# Patient Record
Sex: Female | Born: 1975 | Race: Black or African American | Hispanic: No | Marital: Married | Smoking: Never smoker
Health system: Southern US, Community
[De-identification: ages and names within clinical notes are randomized; demographics above are authoritative.]

## PROBLEM LIST (undated history)

## (undated) HISTORY — PX: OTHER SURGICAL HISTORY: SHX169

---

## 2005-11-21 ENCOUNTER — Emergency Department: Payer: Self-pay | Admitting: Emergency Medicine

## 2009-03-16 ENCOUNTER — Ambulatory Visit: Payer: Self-pay | Admitting: Obstetrics & Gynecology

## 2009-05-17 ENCOUNTER — Inpatient Hospital Stay: Payer: Self-pay

## 2011-01-05 ENCOUNTER — Ambulatory Visit: Payer: Self-pay | Admitting: Family Medicine

## 2011-01-05 ENCOUNTER — Emergency Department: Payer: Self-pay | Admitting: Emergency Medicine

## 2011-07-25 DIAGNOSIS — K219 Gastro-esophageal reflux disease without esophagitis: Secondary | ICD-10-CM | POA: Insufficient documentation

## 2011-07-25 DIAGNOSIS — J309 Allergic rhinitis, unspecified: Secondary | ICD-10-CM | POA: Insufficient documentation

## 2013-07-02 ENCOUNTER — Ambulatory Visit: Payer: Self-pay | Admitting: Obstetrics & Gynecology

## 2013-07-25 ENCOUNTER — Ambulatory Visit: Payer: Self-pay | Admitting: Obstetrics & Gynecology

## 2013-08-03 ENCOUNTER — Emergency Department: Payer: Self-pay | Admitting: Emergency Medicine

## 2013-08-18 ENCOUNTER — Observation Stay: Payer: Self-pay

## 2013-08-18 LAB — URINALYSIS, COMPLETE
RBC,UR: 15499 /HPF (ref 0–5)
Specific Gravity: 1.025 (ref 1.003–1.030)
Squamous Epithelial: 4

## 2013-08-19 ENCOUNTER — Encounter: Payer: Self-pay | Admitting: Maternal & Fetal Medicine

## 2013-08-20 LAB — URINE CULTURE

## 2013-09-03 ENCOUNTER — Encounter: Payer: Self-pay | Admitting: Pediatric Cardiology

## 2013-09-05 ENCOUNTER — Encounter: Payer: Self-pay | Admitting: Maternal & Fetal Medicine

## 2013-10-03 ENCOUNTER — Encounter: Payer: Self-pay | Admitting: Maternal & Fetal Medicine

## 2013-10-31 ENCOUNTER — Encounter: Payer: Self-pay | Admitting: Obstetrics and Gynecology

## 2013-11-14 ENCOUNTER — Inpatient Hospital Stay: Payer: Self-pay | Admitting: Obstetrics and Gynecology

## 2013-11-14 LAB — CBC WITH DIFFERENTIAL/PLATELET
BASOS PCT: 0.2 %
Basophil #: 0 10*3/uL (ref 0.0–0.1)
EOS PCT: 0.6 %
Eosinophil #: 0 10*3/uL (ref 0.0–0.7)
HCT: 32.1 % — ABNORMAL LOW (ref 35.0–47.0)
HGB: 10.7 g/dL — ABNORMAL LOW (ref 12.0–16.0)
LYMPHS ABS: 1.1 10*3/uL (ref 1.0–3.6)
LYMPHS PCT: 21.9 %
MCH: 28.9 pg (ref 26.0–34.0)
MCHC: 33.4 g/dL (ref 32.0–36.0)
MCV: 87 fL (ref 80–100)
Monocyte #: 0.5 x10 3/mm (ref 0.2–0.9)
Monocyte %: 8.9 %
NEUTROS ABS: 3.5 10*3/uL (ref 1.4–6.5)
Neutrophil %: 68.4 %
Platelet: 148 10*3/uL — ABNORMAL LOW (ref 150–440)
RBC: 3.71 10*6/uL — ABNORMAL LOW (ref 3.80–5.20)
RDW: 15.3 % — ABNORMAL HIGH (ref 11.5–14.5)
WBC: 5.1 10*3/uL (ref 3.6–11.0)

## 2013-11-15 LAB — CBC WITH DIFFERENTIAL/PLATELET
BASOS ABS: 0 10*3/uL (ref 0.0–0.1)
BASOS PCT: 0.2 %
EOS ABS: 0 10*3/uL (ref 0.0–0.7)
Eosinophil %: 0 %
HCT: 26.5 % — ABNORMAL LOW (ref 35.0–47.0)
HGB: 8.6 g/dL — ABNORMAL LOW (ref 12.0–16.0)
LYMPHS ABS: 0.8 10*3/uL — AB (ref 1.0–3.6)
Lymphocyte %: 9.9 %
MCH: 28.5 pg (ref 26.0–34.0)
MCHC: 32.6 g/dL (ref 32.0–36.0)
MCV: 87 fL (ref 80–100)
Monocyte #: 0.5 x10 3/mm (ref 0.2–0.9)
Monocyte %: 6.9 %
NEUTROS PCT: 83 %
Neutrophil #: 6.5 10*3/uL (ref 1.4–6.5)
PLATELETS: 150 10*3/uL (ref 150–440)
RBC: 3.03 10*6/uL — ABNORMAL LOW (ref 3.80–5.20)
RDW: 15.5 % — AB (ref 11.5–14.5)
WBC: 7.9 10*3/uL (ref 3.6–11.0)

## 2013-11-16 LAB — HEMATOCRIT: HCT: 22.9 % — ABNORMAL LOW (ref 35.0–47.0)

## 2013-11-17 LAB — HEMATOCRIT: HCT: 22.1 % — ABNORMAL LOW (ref 35.0–47.0)

## 2013-11-18 LAB — PATHOLOGY REPORT

## 2014-11-15 NOTE — Consult Note (Signed)
Referral Information:  Reason for Referral 39 yo gravida 2 para 1001 at 24 weeks 1 day is here for follow-up of regarding the management of her pre-gestational or gestational diabetes that was detected early this pregnancy.   Referring Physician Lyndel PleasureErica Howard NP/[facility]   Prenatal Hx The patient had an abnormal early glucose screen of 164.  On 06/19/2013, she had a 3 hr GTT of 82/198/189/82.  She has been monitoring her BSs subsequently.  She had a [facility]C at [facility] of 5.3 on 02/15/2010 and 5.4 at [facility] on 07[facility]17[facility].  She has seen the nutritionist.  [facility].  She is not currently on any medication for her blood sugars.    Fetal echo was normal on 09/03/2013.  The patient was treated for UTI on 08/18/2013.   Past Obstetrical Hx 05/18/2009 - spontaneous vaginal delivery of 7+ lb female infant at 1538 weeks gestation [facility] at [facility].  Pregnancy complicated by gestational diabetes - diet-controlled.   Home Medications: Medication Instructions Status  Prenatal vitamin one tablet  orally once a day Active  Zantac 150 150 mg oral tablet 1 tab(s) orally 2 times a day, As Needed - for Indigestion, Heartburn Active   Allergies:   Cleocin: Swelling  Flagyl: Swelling  Keflex: SOB, Swelling  Vital Signs/Notes:  Nursing Vital Signs: **Vital Signs.:   12-Feb-15 11:19  Temperature Temperature (F) 97.5  Pulse Pulse 98  Respirations Respirations 18  Systolic BP Systolic BP 129  Diastolic BP (mmHg) Diastolic BP (mmHg) 65  Pulse Ox % Pulse Ox % 99   Perinatal Consult:  LMP 23-Mar-2013   PGyn Hx Benign   PSurg Hx None   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   Soc Hx married, No substances   Review Of Systems:  Fever/Chills No   Cough No   Abdominal Pain No   Diarrhea No   Constipation Intermittently, does not need medication   Nausea/Vomiting No   SOB/DOE No   Chest Pain No   Dysuria No   Tolerating Diet Yes    Medications/Allergies Reviewed Medications/Allergies reviewed   Exam:  Today's Weight 192 lbs; 5'1"    Additional Lab/Radiology Notes BMI = 36.2  RBS today - 77  Limited US today - Breech, FHR 127, normal fluid  Blood sugars since 08/19/2013:   FBSs - 78-134 with 2/18 > 95 2 hrs after breakfast  - 75-134 with 1/15 > 120 2 hrs after lunch - 81-133 with 2/15 > 120 2 hrs after dinner - 91-145 with 3/12 > 120   Impression/Recommendations:  Impression 39 yo gravida 2 para 1001 at 24 weeks 1 day gestation with pre-gestational or gestational diabetes that was detected early this pregnancy still diet-controlled Advanced maternal age 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 I have asked her to check a FBS every other day and one 2 hr PP every day.    At the present time, I would not recommend a hypoglycemic agent. The patient is aware that the target range for FBSs is < 95 and for 2 hrs PP is < 120.  I will review her BSs again on 3/12. 2. Because of her risk factors besides diet-controlled diabetes (possibly pre-gestational diabetes, obesity and AMA), I previously recommended fetal surveillance as outlined below to include monthly ultrasounds starting at [redacted] weeks gestation (her next one is scheduled here on 3/12), weekly testing after 36 weeks and delivery at [redacted] weeks gestation.  3. If she ultimately requires treatment with a hypoglycemic, I would recommend starting the surveillance at [redacted] weeks gestation and increasing to twice weekly at [redacted] weeks gestation with consideration for delivery at 37 weeks if there is evidence of diabetic fetopathy, macrosomia or polyhydramnios.   Plan:  Ultrasound at what gestational ages Monthly > 28 weeks   Antepartum Testing Weekly, Starting at [redacted] weeks gestation   Delivery at what gestational age [redacted] weeks    Comments Thank you for allowing [place] to participate in her care.    Total Time Spent with Patient 30 minutes   >50% of visit spent in  couseling/coordination of care yes   Office Use Only 99214  Office Visit Level 4 ( ) EST detailed office/outpt   Coding Description: MATERNAL CONDITIONS/HISTORY INDICATION(S).   Adv. Maternal Age - multigravida.   Diabetes - DM.   Obesity - BMI greater than equal to 30.  Electronic Signatures: Lady Deutscher (MD)  (Signed 12-Feb-15 12:28)  Authored: Referral, Home Medications, Allergies, Vital Signs/Notes, Consult, Exam, Lab/Radiology Notes, Impression, Plan, Other Comments, Billing, Coding Description   Last Updated: 12-Feb-15 12:28 by Lady Deutscher (MD)

## 2014-11-15 NOTE — Consult Note (Signed)
Referral Information:  Reason for Referral Followup diabetes in pregnancy   Referring Physician [facility]   Prenatal Hx 39 year-old G2 P1 at 4727 5/7 weeks with pre-existing vs. gestational diabetes who returns for growth scan and to review her sugar log.  Meliss had an early glucola of 164 followed by a 3hr GTT of 82/198/189/82. She has been seen by a nutritionist, is following a diet and monitoring her sugars. She has not required medication. She had a hemoglocin A1C of 5.4% [facility] on 07/17/13.  She had a normal anatomy scan with [facility] and normal fetal echo with [facility].   Pt declined genetic counseling for Advanced Maternal Age but had Harmony [facility] which was the patient states was normal.  See prior MFM consults dated 09/05/13 adn 08/19/13 for recommendations concerning pregnancy management.   Home Medications: Medication Instructions Status  Prenatal vitamin one tablet  orally once a day Active  Zantac 150 150 mg oral tablet 1 tab(s) orally 2 times a day, As Needed - for Indigestion, Heartburn Active   Allergies:   Cleocin: Swelling  Flagyl: Swelling  Keflex: Swelling  Vital Signs/Notes:  Nursing Vital Signs:  **Vital Signs.:   12-Mar-15 13:55  Systolic BP Systolic BP 118  Diastolic BP (mmHg) Diastolic BP (mmHg) 65    Additional Lab/Radiology Notes Accuchecks (09/05/13-10/03/13): FS:87, 93, 90, 77, 82, 92, 79, 84, 79, 86, 87, 90, 92, 81, 101, 108, 85, 79, 77, 79 2hr PP breakfast: 94, 87, 124, 112, 115, 92, 112, 87\ 2hr PP lunch: 101, 112, 69, 73, 119, 122, 100, 80 2hr PP dinner: 119, 124, 112, 117, 81, 173, 111   Impression/Recommendations:  Impression 39 year-old G2 P1 at 4427 5/7 weeks with diabetes in pregnancy (pre-existing vs. early gestational) that is under good control with diet alone.   Recommendations -Continue to follow diet -Check daily fastings and then one two hour PP daily (alternate which meal) -US  today shows EFW at 92%. Recommend to repeat A1C at next Ob visit -Return in four weeks for MFM consult (to review log) and for growth scan. If sugars begin to increase out of range prior to that, we would be happy to see her sooner. -See US report from today. -Continue with fetal testing plan as outlined in prior consult notes.    Total Time Spent with Patient 15 minutes   >50% of visit spent in couseling/coordination of care yes   Office Use Only 99213  Office Visit Level 3 (15min) EST exp prob focused outpt   Coding Description: MATERNAL CONDITIONS/HISTORY INDICATION(S).   Adv. Maternal Age - multigravida.   Diabetes - DM.  Electronic Signatures: Enza Shone, [place] (MD)  (Signed 12-Mar-15 15:24)  Authored: Referral, Home Medications, Allergies, Vital Signs/Notes, Lab/Radiology Notes, Impression, Billing, Coding Description   Last Updated: 12-Mar-15 15:24 by Tyquarius Paglia, [place] (MD)

## 2014-11-15 NOTE — Op Note (Signed)
PATIENT NAME:  Robin Norris, Robin Norris MR#:  [id number] DATE OF BIRTH:  1975-11-01  DATE OF PROCEDURE:  11/15/2013  PREOPERATIVE DIAGNOSIS: Preterm premature rupture of membranes with a breech presentation and fetal intolerance to labor.   POSTOPERATIVE DIAGNOSIS: Preterm premature rupture of membranes with a breech presentation and fetal intolerance to labor.   PROCEDURE: Low transverse cesarean section.   SURGEON: Dierdre Searles. Paul Juell Radney, MD  ANESTHESIA: Spinal.   ESTIMATED BLOOD LOSS: 1000 mL.   COMPLICATIONS: None.   FINDINGS: Viable female infant weighing 5 pounds, 12 ounces with Apgar scores of 8 and 9 at one and five minutes, respectively. Normal tubes, ovaries, and uterus with fibroids noted in the uterus, multiple small ones.   DISPOSITION: To the recovery room in stable condition.   TECHNIQUE: The patient is prepped and draped in the usual sterile fashion after adequate anesthesia is obtained in the supine position on the operating room table. Scalpel is used to create a low transverse skin incision down the level of the rectus fascia which is dissected bilaterally using Mayo scissors. Rectus muscles are dissected away from the fascia and then separated in the midline. The peritoneum is penetrated and the bladder is inferiorly dissected and retracted. A scalpel is used to create a low transverse hysterotomy incision that is then extended by blunt dissection. The feet are observed to be at this location and they are easily delivered in a sacrum posterior presentation with footling breech. Once the hips are reached, the baby is gently rotated to the sacrum anterior position, arms are swept across the chest, and the head is easily delivered. The infant is handed to the pediatric team with a vigorous cry.   Cord blood is obtained, placenta is manually extracted, and the uterus is externalized and cleansed of all membranes and debris using a moist sponge. The hysterotomy incision is closed with a  running #1 Vicryl suture in a locking fashion followed by additional sutures to obtain excellent hemostasis. There is some bleeding noted lower to the incision where the bladder had been dissected off of the cervix and lower uterine segment and sutures are placed to obtain hemostasis along with Surgicel. The uterus is placed back in the intra-abdominal cavity and the paracolic gutters are irrigated with warm saline. Re-examination of the incision reveals excellent hemostasis. The peritoneum is closed with a Vicryl suture. The rectus fascia is closed with 0 Maxon suture. Subcutaneous tissues are irrigated and hemostasis is assured using electrocautery. Skin is closed with a 4-0 Vicryl suture in a subcuticular fashion followed by Dermabond. The patient goes to the recovery room in stable condition. All sponge, instrument and needle counts are correct.   ____________________________ R. Annamarie MajorPaul Charlissa Petros, MD rph:sb D: 11/15/2013 04:45:42 ET T: 11/15/2013 07:22:05 ET JOB#: [id number]  cc: Dierdre Searles. Paul Talon Regala, MD, <Dictator> Nadara MustardOBERT P Francesco Provencal MD ELECTRONICALLY SIGNED 11/15/2013 7:39

## 2014-11-15 NOTE — Consult Note (Signed)
Referral Information:  Reason for Referral Followup diabetes in pregnancy- review blood sugar log   Referring Physician [facility]   Prenatal Hx 39 year-old G2 P1 at 2731 5/7 weeks with pre-existing vs. gestational diabetes who returns for growth scan and to review her sugar log.  Robin Norris had an early glucola of 164 followed by a 3hr GTT of 82/198/189/82. She has been seen by a nutritionist, is following a diet and monitoring her sugars. She has not required medication. She had a hemoglocin A1C of 5.4% at [facility] on 07/17/13.  She had a normal anatomy scan with [facility] and normal fetal echo with [facility].   Pt declined genetic counseling for Advanced Maternal Age but had Harmony [facility] which was the patient states was normal.  h/o uti this pregnancy pt reports doing well on her diet   Past Obstetrical Hx 2010 spontaneous vaginal delivery 7-+ lb infant [facility] no complications - had GDM then no meds   Home Medications: Medication Instructions Status  ferrous sulfate 325 mg oral delayed release tablet 1 tab(s) orally once a day Active  Prenatal vitamin one tablet  orally once a day Active   Allergies:   Cleocin: Swelling  Flagyl: Swelling  Keflex: Swelling  Vital Signs/Notes:  Nursing Vital Signs: **Vital Signs.:   09-Apr-15 10:57  Vital Signs Type Routine  Temperature Temperature (F) 97.8  Celsius 36.5  Temperature Source oral  Pulse Pulse 100  Respirations Respirations 20  Systolic BP Systolic BP 133  Diastolic BP (mmHg) Diastolic BP (mmHg) 74  Mean BP 93  Pulse Ox % Pulse Ox % 99  Pulse Ox Activity Level  At rest  Oxygen Delivery Room Air/ 21 %   Perinatal Consult:  Past Medical History cont'd UTI  h/o GDM   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 early gestational) that is under good control with diet alone. EFW 90 % both HC and AC are large , normal fluid - not typical diabetic "fetopathy"   Recommendations -Continue to follow diet -Check daily fastings and then one two hour PP daily (alternate which meal) -US today shows EFW at 90% -Return in four weeks for growth BS check  -See US report from today.   Plan:  Antepartum Testing Weekly, weekly at 34 twice weekly at 36   Delivery Mode Vaginal   Delivery at what gestational age 439 weeks    Total Time Spent with Patient 15 minutes   >50% of visit spent in couseling/coordination of care yes   Office Use Only 99213  Office Visit Level 3 (15min) EST exp prob focused outpt   Coding Description: MATERNAL CONDITIONS/HISTORY INDICATION(S).   Adv. Maternal Age - multigravida.   Diabetes - DM.  Electronic Signatures: Rondall AllegraLivingston, Shyane Fossum G[place]am (MD)  (Signed 09-Apr-15 16:59)  Authored: Referral, Home Medications, Allergies, Vital Signs/Notes, Consult, Lab/Radiology Notes, Impression, Plan, Billing, Coding Description   Last Updated: 09-Apr-15 16:59 by Rondall AllegraLivingston, Jaxsin Bottomley G[place]am (MD)

## 2014-11-15 NOTE — Consult Note (Signed)
Referral Information:  Reason for Referral 39 yo gravida 2 para 1001 at 21 weeks 5 days is referred for recommendations regarding the management of her pre-gestational or gestational diabetes that was detected early this pregnancy.   Referring Physician Lyndel Pleasure NP/[facility]   Prenatal Hx The patient had an abnormal early glucose screen of 164.  On 06/19/2013, she had a 3 hr GTT of 82/198/189/82.  She has been monitoring her BSs subsequently.  She had a HbA1C at [facility] of 5.3 on 02/15/2010 and 5.4 at [facility] on 07/17/2013.  She has seen the nutritionist.  She is not currently on any medication for her blood sugars.  Yesterday, after presenting with hematuria, she was observed for 8 hours on Labor and Delivery and treated for UTI.  The culture is growing > 100,000 E. coli.   Past Obstetrical Hx 05/18/2009 - spontaneous vaginal delivery of 7+ lb female infant at [redacted] weeks gestation here at [facility].  Pregnancy complicated by gestational diabetes - diet-controlled.   Home Medications: Medication Instructions Status  Prenatal vitamin one tablet  orally once a day Active  amoxicillin 500 mg oral capsule 1 cap(s) orally 3 times a day Active  Diflucan 50 mg oral tablet 1 tab(s) orally once a day Active  AZO Urinary Pain Relief 97.5 mg oral tablet 2 tab(s) orally 3 times a day (after meals) Active  Zantac 150 150 mg oral tablet 1 tab(s) orally 2 times a day, As Needed - for Indigestion, Heartburn Active   Allergies:   Cleocin: Swelling  Flagyl: Swelling  Keflex: SOB, Swelling  Vital Signs/Notes:  Nursing Vital Signs: **Vital Signs.:   26-Jan-15 10:59  Temperature Temperature (F) 98.2  Celsius 36.7  Pulse Pulse 105  Respirations Respirations 18  Systolic BP Systolic BP 111  Diastolic BP (mmHg) Diastolic BP (mmHg) 58  Pulse Ox % Pulse Ox % 100   Perinatal Consult:  LMP 23-Mar-2013   PGyn Hx Benign   PSurg Hx None   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   Soc Hx  married, No substances   Review Of Systems:  Fever/Chills No   Cough No   Abdominal Pain No   Diarrhea No   Constipation No   Nausea/Vomiting No   SOB/DOE No   Chest Pain No   Dysuria No   Tolerating Diet Yes   Medications/Allergies Reviewed Medications/Allergies reviewed   Exam:  Today's Weight 191 lbs; 5'1"    Additional Lab/Radiology Notes BMI = 36 Blood sugars from the last week FBSs - 75, 98, 77, 79, 134 (after IV) 2 hrs after breakfast  - 116 2 hrs after lunch 138, 81 2 hrs after dinner 156   Impression/Recommendations:  Impression 39 yo gravida 2 para 1001 at 21 weeks 2 days gestation with pre-gestational or gestational diabetes that was detected early this pregnancy now mostly diet-controlled Advanced maternal age 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 The patient has seen the nutritionist and has an idea of what impacts her blood sugars 2. She has  a fetal echo scheduled for 09/03/2013 3. I have asked her to keep close track of her blood sugars for the next two weeks and I will see her again on 09/05/2013.  At the present time, I would not recommend a hypoglycemic agent as I do not have sufficient evidence that more than 1/3 of her blood sugars are outside of target range at any one time point.  The patient is aware that  the target range for FBSs is < 95 and for 2 hrs PP is < 120. 4. Because of her risk factors besides diet-controlled diabetes (possibly pre-gestational diabetes, borderline control, obesity and AMA), I would recommend fetal surveillance as outlined below to included monthly ultrasounds starting at [redacted] weeks gestation, weekly testing after 36 weeks and delivery at [redacted] weeks gestation.  If she ultimately requires treatment with a hypoglycemic, I would recommend starting the surveillance at [redacted] weeks gestation and increasing to twice weekly at [redacted] weeks gestation with consideration for delivery at 37 weeks if there is evidence of diabetic  fetopathy, macrosomia or polyhydramnios.   Plan:  Ultrasound at what gestational ages Monthly > 28 weeks   Antepartum Testing Weekly, Starting at 4736 weeks gestation   Delivery at what gestational age [redacted] weeks    Comments Thank you for allowing us to participate in her care.    Total Time Spent with Patient 45 minutes   >50% of visit spent in couseling/coordination of care yes   Office Use Only 99243  Level 3 (40min) NEW office consult detailed   Coding Description: MATERNAL CONDITIONS/HISTORY INDICATION(S).   Adv. Maternal Age - multigravida.   Diabetes - DM.   Obesity - BMI greater than equal to 30.  Electronic Signatures: Lady DeutscherJames, Lenoir Facchini (MD)  (Signed 26-Jan-15 13:18)  Authored: Referral, Home Medications, Allergies, Vital Signs/Notes, Consult, Exam, Lab/Radiology Notes, Impression, Plan, Other Comments, Billing, Coding Description   Last Updated: 26-Jan-15 13:18 by Lady DeutscherJames, Catrice Zuleta (MD)

## 2014-11-15 NOTE — Consult Note (Signed)
   Maternal Age 39   Gravida 2   Para 1   Term Deliveries 1   Preterm Deliveries 0   Abortions 0   Living Children 1   Final EDD (dd-[id number]) 28-Dec-2013   GA Assessment: (Weeks) 33 week(s)   (Days) 5 day(s)   Gestation Single   Blood Type (Maternal) B positive   Antibody Screen Results (Maternal) negative   HIV Results (Maternal) negative   Gonorrhea Results (Maternal) negative   Chlamydia Results (Maternal) negative   Hepatitis C Culture (Maternal) unknown   Herpes Results (Maternal) n/a   VDRL/RPR/Syphilis Results (Maternal) negative   Varicella Titer Results (Maternal) Positive   Rubella Results (Maternal) immune   Hepatitis B Surface Antigen Results (Maternal) negative   Group B Strep Results Maternal (Result >5wks must be treated as unknown) unknown/result > 5 weeks ago   Prenatal Care Adequate    Additional Comments This is a 39 yo G2 P1001 who was admitted to LDR this morning for PROM at 33 and 5/[redacted] weeks gestation.  So far she does not show signs of labor and has been given her first dose of betamentasone, 2nd to be given tomorrow.  Her pregnancy has been complicated by AMA and GDM (diet controlled).  [facility] times this pregnancy.  A fetal echo was normal at 23 weeks and the most recent EFW at 31 wks was 4 # 14oz (90%).  The infant is breech.   I had the pleasure of speaking with [name] today.  I explained that the neonatal intensive care team would be present for the delivery and outlined the likely delivery room course for this baby including routine resuscitation and NRP-guided approaches to the treatment of respiratory distress. We discussed other common problems associated with prematurity including respiratory distress syndrome/CLD, apnea, feeding issues, temperature regulation, and infection risk.  We briefly discussed IVH/PVL, ROP, and NEC and that these are complications associated with prematurity, but  that by 33 weeks are uncommon.    We discussed the average length of stay but I noted that the actual LOS would depend on the severity of problems encountered and response to treatments.  We discussed visitation policies and the resources available while her child is in the hospital.   We discussed the importance of good nutrition and various methods of providing nutrition (parenteral hyperalimentation, gavage feedings and/or oral feeding). [name] is not planning to provide breast milk.    Thank you for involving us in the care of this patient. A member of our team will be available should the family have additional questions.  Time for consultation approximately 45 minutes.   Parental Contact Parents informed at length regarding prenatal care and plan   Electronic Signatures: Robin Norris, Robin Norris (MD)  (Signed 23-Apr-15 13:52)  Authored: PREGNANCY and LABOR, ADDITIONAL COMMENTS   Last Updated: 23-Apr-15 13:52 by Robin Norris, Robin Norris (MD)

## 2014-12-02 NOTE — H&P (Signed)
L&D Evaluation:  History:  HPI 39 year old G2 P1001 with EDC=12/28/2013 by LMP=03/23/2013 presents at 21.1 weeks with c/o bloody urine and frequency of urination (having to  urinate every few minutes) since 0130 this AM. Now has dysuria. Denies abdominal pain or back pain. No hx of stones or frequent UTIs. Has blood also on toliet tissue when wiping and on pad she is wearing. Unsure whether she is having vaginal bleeding. Had the flu 2 weeks ago with URI sx, but has been  eating and drinking regular diet. Has had reflux and heartburn this PM after eating chicken. Has a lingering cough since having the flu. [facility] AMA with negative NIFT, anemia, and  GDM diagnosed early in pregnancy ?pregestational diabetes.   Presents with hematuria, frequency, dysuria   Patient's Medical History GDM   Patient's Surgical History none   Medications Pre Natal Vitamins  Zantac   Allergies Keflex (SOB, rash), Cleocin (itching), Flagylitching)   Social History none   Family History Non-Contributory   ROS:  ROS see HPI   Exam:  Vital Signs stable   Urine Protein >15,000RBC/HPF, 800WBC/HPF   General no apparent distress   Mental Status clear   Chest clear   Heart normal sinus rhythm, no murmur/gallop/rubs   Abdomen gravid, non-tender   Back no CVAT   Pelvic no external lesions, no blood in vault; cx appears thick and closed   Mebranes Intact   FHT 140s   Ucx absent   Skin dry   Impression:  Impression IUP at 21 weeks with hemorrhagic cystitis   Plan:  Plan Due to allergy to Keflex, will not use cephalosporin. Trial of ampicillin IV-will give slowly and watch for rxn. Will also give Pyridium. POM discussed with Dr Tiburcio PeaHarris.   Electronic Signatures: Trinna BalloonGutierrez, Sky Primo L (CNM)  (Signed 25-Jan-15 07:02)  Authored: L&D Evaluation   Last Updated: 25-Jan-15 07:02 by Trinna BalloonGutierrez, Kharter Brew L (CNM)

## 2014-12-02 NOTE — H&P (Signed)
L&D Evaluation:  History Expanded:  HPI 39 yo G2 P1001, EDD of 12/28/13 per LMP and confirmed by 12 wk US, presents at 5131w5d with c/o large gush of fluid at approximately 0030. Reports occasional contractions, +FM, no VB. Some nausea and vomiting for the past few days.   [facility] to care, GDM A1 diagnosed by 2 abnl values on 3 hr GCT at 13 wks. Pt has seen [facility] several times this pregnancy, most recent EFW at 31 wks= 4 # 14oz (90%), breech presentation. AMA with negative informaseq, normal fetal echo at 23 weeks. Anemia treated with Fe.   Blood Type (Maternal) B positive   Group B Strep Results Maternal (Result >5wks must be treated as unknown) unknown/result > 5 weeks ago   Maternal HIV Negative   Maternal Syphilis Ab Nonreactive   Maternal Varicella Immune   Rubella Results (Maternal) immune   Maternal T-Dap Immune   Patient's Medical History No Chronic Illness  GDM with G1   Patient's Surgical History none   Medications Pre Natal Vitamins  Iron  Prilosec   Allergies Keflex, Cleocin, Flagyl (all topical - skin swelling), Flu vaccine   Social History none   Exam:  Vital Signs stable   General no apparent distress   Mental Status clear   Chest clear   Heart no murmur/gallop/rubs   Abdomen gravid, non-tender   Estimated Fetal Weight Large for gestational age   Fetal Position breech per bedside US   Pelvic no external lesions, 1/50/high per RN   Mebranes Ruptured, grossly ruptured   Description clear   FHT normal rate with no decels, baseline 140, + accelerations   Ucx irregular   Other GBS collected   Impression:  Impression IUP at 33 5/7 wks with PPROM   Plan:  Comments Discussed POM for PPROM before 34 with pt and husband - plan to deliver at 34 weeks if stable. Given breech presentation - cesarean delivery will likely be necessary.  Betamethasone for fetal lung maturity  Ampicillin 2 grams q 6 hrs and Azithromycin 1  gram po with breakfast Continue FSBS (fasting and 2 hr PP after lunch)   Electronic Signatures: Misa Fedorko, Marta Lamasamara K (CNM)  (Signed 23-Apr-15 03:19)  Authored: L&D Evaluation   Last Updated: 23-Apr-15 03:19 by Vella KohlerBrothers, Travonne Schowalter K (CNM)

## 2015-03-11 DIAGNOSIS — D573 Sickle-cell trait: Secondary | ICD-10-CM | POA: Insufficient documentation

## 2015-05-22 ENCOUNTER — Telehealth: Payer: Self-pay | Admitting: Gastroenterology

## 2015-05-22 ENCOUNTER — Other Ambulatory Visit: Payer: Self-pay

## 2015-05-22 MED ORDER — DEXLANSOPRAZOLE 60 MG PO CPDR: 60.0000 mg | DELAYED_RELEASE_CAPSULE | Freq: Every day | ORAL | Status: DC

## 2015-05-22 NOTE — Telephone Encounter (Signed)
Returned pt's call regarding dexilant. Advised pt I have submitted prior authorization to her insurance company. Waiting on a response from them.

## 2015-05-22 NOTE — Telephone Encounter (Signed)
Patient left a voice message that [facility] faxed over a refill request and she is out of her medication. She didn't leave the name of it or which [facility].

## 2015-11-11 ENCOUNTER — Other Ambulatory Visit: Payer: Self-pay

## 2015-11-11 DIAGNOSIS — K2100 Gastro-esophageal reflux disease with esophagitis, without bleeding: Secondary | ICD-10-CM

## 2015-11-11 MED ORDER — DEXLANSOPRAZOLE 60 MG PO CPDR: 60.0000 mg | DELAYED_RELEASE_CAPSULE | Freq: Every day | ORAL | Status: DC

## 2015-11-20 ENCOUNTER — Other Ambulatory Visit: Payer: Self-pay

## 2017-10-04 ENCOUNTER — Other Ambulatory Visit: Payer: Self-pay | Admitting: Family Medicine

## 2017-10-04 DIAGNOSIS — Z1239 Encounter for other screening for malignant neoplasm of breast: Secondary | ICD-10-CM

## 2017-10-18 ENCOUNTER — Ambulatory Visit
Admission: RE | Admit: 2017-10-18 | Discharge: 2017-10-18 | Disposition: A | Discharge status: Home / Self Care | Payer: BC Managed Care – PPO | Source: Ambulatory Visit | Attending: Family Medicine | Admitting: Family Medicine

## 2017-10-18 ENCOUNTER — Encounter: Payer: Self-pay | Admitting: Radiology

## 2017-10-18 DIAGNOSIS — Z1231 Encounter for screening mammogram for malignant neoplasm of breast: Secondary | ICD-10-CM | POA: Insufficient documentation

## 2017-10-18 DIAGNOSIS — Z1239 Encounter for other screening for malignant neoplasm of breast: Secondary | ICD-10-CM

## 2017-11-01 ENCOUNTER — Other Ambulatory Visit: Payer: Self-pay | Admitting: *Deleted

## 2017-11-01 ENCOUNTER — Inpatient Hospital Stay
Admission: RE | Admit: 2017-11-01 | Discharge: 2017-11-01 | Disposition: A | Discharge status: Home / Self Care | Payer: Self-pay | Source: Ambulatory Visit | Attending: *Deleted | Admitting: *Deleted

## 2017-11-01 DIAGNOSIS — Z9289 Personal history of other medical treatment: Secondary | ICD-10-CM

## 2018-06-27 ENCOUNTER — Other Ambulatory Visit: Payer: Self-pay

## 2018-06-27 ENCOUNTER — Emergency Department
Admission: EM | Admit: 2018-06-27 | Discharge: 2018-06-27 | Disposition: A | Discharge status: Home / Self Care | Payer: No Typology Code available for payment source | Attending: Student in an Organized Health Care Education/Training Program | Admitting: Student in an Organized Health Care Education/Training Program

## 2018-06-27 ENCOUNTER — Emergency Department: Payer: No Typology Code available for payment source

## 2018-06-27 DIAGNOSIS — Y9389 Activity, other specified: Secondary | ICD-10-CM | POA: Diagnosis not present

## 2018-06-27 DIAGNOSIS — S93402A Sprain of unspecified ligament of left ankle, initial encounter: Secondary | ICD-10-CM

## 2018-06-27 DIAGNOSIS — Y92219 Unspecified school as the place of occurrence of the external cause: Secondary | ICD-10-CM | POA: Insufficient documentation

## 2018-06-27 DIAGNOSIS — Y999 Unspecified external cause status: Secondary | ICD-10-CM | POA: Diagnosis not present

## 2018-06-27 DIAGNOSIS — Z79899 Other long term (current) drug therapy: Secondary | ICD-10-CM | POA: Insufficient documentation

## 2018-06-27 DIAGNOSIS — S99912A Unspecified injury of left ankle, initial encounter: Secondary | ICD-10-CM | POA: Diagnosis present

## 2018-06-27 DIAGNOSIS — M25511 Pain in right shoulder: Secondary | ICD-10-CM | POA: Diagnosis not present

## 2018-06-27 MED ORDER — TRAMADOL HCL 50 MG PO TABS: 50.0000 mg | ORAL_TABLET | Freq: Four times a day (QID) | ORAL | 0 refills | Status: DC | PRN

## 2018-06-27 MED ORDER — IBUPROFEN 600 MG PO TABS: 600.0000 mg | ORAL_TABLET | Freq: Four times a day (QID) | ORAL | 0 refills | Status: DC | PRN

## 2018-06-27 MED ORDER — IBUPROFEN 800 MG PO TABS
800.0000 mg | ORAL_TABLET | Freq: Once | ORAL | Status: AC
  Administered 2018-06-27: 800 mg via ORAL
  Filled 2018-06-27: qty 1
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
## 2018-11-09 ENCOUNTER — Telehealth: Payer: Self-pay

## 2018-11-09 NOTE — Telephone Encounter (Signed)
Pt calling trying to get appt to remove IUD and reinsert new one.  It's coming up on 5hrs.  [phone]  Left detailed msg that it's not due to come out until 04/10/19.  Also, has she been getting annual exams elsewhere as we haven't seen her in quite some time.  Explained that ins wouldn't allow [place] to do AEX and remove/reinsert at same visit.  To call the end of July or in August to schedule.

## 2018-11-13 ENCOUNTER — Telehealth: Payer: Self-pay | Admitting: Obstetrics and Gynecology

## 2018-11-13 NOTE — Telephone Encounter (Signed)
Patient scheduled 8/6 to have mirena replaced with AMS in [place].

## 2018-11-15 NOTE — Telephone Encounter (Signed)
Noted. Will order to arrive by apt date/time. 

## 2019-01-29 ENCOUNTER — Other Ambulatory Visit: Payer: Self-pay | Admitting: Family Medicine

## 2019-01-29 DIAGNOSIS — Z1231 Encounter for screening mammogram for malignant neoplasm of breast: Secondary | ICD-10-CM

## 2019-02-11 ENCOUNTER — Other Ambulatory Visit: Payer: Self-pay

## 2019-02-11 ENCOUNTER — Encounter (INDEPENDENT_AMBULATORY_CARE_PROVIDER_SITE_OTHER): Payer: Self-pay

## 2019-02-11 ENCOUNTER — Ambulatory Visit
Admission: RE | Admit: 2019-02-11 | Discharge: 2019-02-11 | Disposition: A | Discharge status: Home / Self Care | Payer: BC Managed Care – PPO | Source: Ambulatory Visit | Attending: Family Medicine | Admitting: Family Medicine

## 2019-02-11 DIAGNOSIS — Z1231 Encounter for screening mammogram for malignant neoplasm of breast: Secondary | ICD-10-CM | POA: Diagnosis not present

## 2019-02-13 ENCOUNTER — Encounter: Payer: Self-pay | Admitting: Obstetrics and Gynecology

## 2019-02-13 ENCOUNTER — Ambulatory Visit (INDEPENDENT_AMBULATORY_CARE_PROVIDER_SITE_OTHER): Payer: BC Managed Care – PPO | Admitting: Obstetrics and Gynecology

## 2019-02-13 ENCOUNTER — Other Ambulatory Visit: Payer: Self-pay

## 2019-02-13 ENCOUNTER — Other Ambulatory Visit (HOSPITAL_COMMUNITY)
Admission: RE | Admit: 2019-02-13 | Discharge: 2019-02-13 | Disposition: A | Discharge status: Home / Self Care | Payer: BC Managed Care – PPO | Source: Ambulatory Visit | Attending: Obstetrics and Gynecology | Admitting: Obstetrics and Gynecology

## 2019-02-13 VITALS — BP 125/79 | HR 93 | Ht 61.0 in | Wt 203.0 lb

## 2019-02-13 DIAGNOSIS — Z124 Encounter for screening for malignant neoplasm of cervix: Secondary | ICD-10-CM

## 2019-02-13 DIAGNOSIS — Z30433 Encounter for removal and reinsertion of intrauterine contraceptive device: Secondary | ICD-10-CM | POA: Diagnosis not present

## 2019-02-13 DIAGNOSIS — Z3049 Encounter for surveillance of other contraceptives: Secondary | ICD-10-CM | POA: Diagnosis not present

## 2019-02-13 NOTE — Progress Notes (Addendum)
Obstetrics & Gynecology Office Visit   Chief Complaint:  Chief Complaint  Patient presents with  . Contraception    Mirena IUD removal/reinsertion  . Leaking urine    History of Present Illness: Patient is a 43 y.o. G2P2 presenting for contraception consult.  She is currently on IUD and desiring to start IUD.  She has a past medical history significant for no contraindication to estrogen.  She specifically denies a history of migraine with aura, chronic hypertension, history of DVT/PE and smoking.  Reported No LMP recorded (exact date). (Menstrual status: IUD)..    Review of Systems: Review of Systems  Constitutional: Negative.   Genitourinary: Negative.   Skin: Negative.     Past Medical History:  No past medical history on file.  Past Surgical History:  Past Surgical History:  Procedure Laterality Date  . CESAREAN SECTION  11/15/2013  . Mirena IUD placement      Gynecologic History:  No LMP recorded. (Menstrual status: IUD). Contraception:04/10/2015 Mirena IUD Last Pap: Results were:03/30/2016 NIL and HR HPV negative  Last mammogram:02/11/2019 Results were: BI-RAD I   Obstetric History: G2P2  Family History:  Family History  Problem Relation Age of Onset  . Breast cancer Maternal Aunt 60    Social History:  Social History   Socioeconomic History  . Marital status: Married    Spouse name: Not on file  . Number of children: Not on file  . Years of education: Not on file  . Highest education level: Not on file  Occupational History  . Not on file  Social Needs  . Financial resource strain: Not on file  . Food insecurity    Worry: Not on file    Inability: Not on file  . Transportation needs    Medical: Not on file    Non-medical: Not on file  Tobacco Use  . Smoking status: Never Smoker  . Smokeless tobacco: Never Used  Substance and Sexual Activity  . Alcohol use: Yes    Comment: socially  . Drug use: Never  . Sexual activity: Yes    Birth  control/protection: I.U.D.  Lifestyle  . Physical activity    Days per week: Not on file    Minutes per session: Not on file  . Stress: Not on file  Relationships  . Social Herbalist on phone: Not on file    Gets together: Not on file    Attends religious service: Not on file    Active member of club or organization: Not on file    Attends meetings of clubs or organizations: Not on file    Relationship status: Not on file  . Intimate partner violence    Fear of current or ex partner: Not on file    Emotionally abused: Not on file    Physically abused: Not on file    Forced sexual activity: Not on file  Other Topics Concern  . Not on file  Social History Narrative  . Not on file    Allergies:  Allergies  Allergen Reactions  . Miconazole Other (See Comments)    Swelling of the vaginal area  . Cephalexin Other (See Comments)  . Clindamycin Other (See Comments)  . Influenza Vaccines Rash and Swelling    Swelling and rash at site of injection    Medications: Prior to Admission medications   Medication Sig Start Date End Date Taking? Authorizing Provider  levonorgestrel (MIRENA) 20 MCG/24HR IUD by Intrauterine route.  [provider]    Physical Exam Vitals: Blood pressure 125/79, pulse 93, height 5\' 1"  (1.549 m), weight 203 lb (92.1 kg).  No LMP recorded (exact date). (Menstrual status: IUD).  General: NAD, appears stated age, well nourished HEENT: normocephalic, anicteric Pulmonary: No increased work of breathing  External: Normal external female genitalia.  Normal urethral meatus, normal  Bartholin's and Skene's glands.    Vagina: Normal vaginal mucosa, no evidence of prolapse.    Cervix: Grossly normal in appearance, no bleeding, IUD string visualized  Uterus: Non-enlarged, mobile, normal contour.  No CMT  Adnexa: ovaries non-enlarged, no adnexal masses  Rectal: deferred  Lymphatic: no evidence of inguinal lymphadenopathy Extremities: no  edema, erythema, or tenderness Neurologic: Grossly intact Psychiatric: mood appropriate, affect full  Female chaperone present for pelvic  portions of the physical exam    GYNECOLOGY OFFICE PROCEDURE NOTE  Bennie DallasDawn Gallion-Dailey is a 43 y.o. G2P2 here for IUD removal and reinsertion. The patient currently has a Mirena  IUD placed on 03/2014, which will be replaced with a Mirena IUD today.  No GYN concerns.  Last pap smear was on 2017 and was normal.  IUD Removal and Reinsertion  Patient identified, informed consent performed, consent signed.   Discussed risks of irregular bleeding, cramping, infection, malpositioning or uterine perforation of the IUD which may require further procedures. Time out was performed. Speculum placed in the vagina. The strings of the IUD were grasped and pulled using ring forceps. The IUD was successfully removed in its entirety. The cervix was cleaned with Betadine x 2 and grasped anteriorly with a single tooth tenaculum.  The uterus was sounded to IUD insertion apparatus was used to sound the uterus to 9 cm using a uterine sound.  The IUD was then placed per manufacturer's recommendations. Strings trimmed to 3 cm. Tenaculum was removed, good hemostasis noted. Patient tolerated procedure well.   Patient was given post-procedure instructions.  Patient was also asked to check IUD strings periodically and follow up in 6 weeks for IUD check.  Assessment: 43 y.o. G2P2 here for Mirena IUD replacement  Plan: Problem List Items Addressed This Visit    None    Visit Diagnoses    Screening for malignant neoplasm of cervix    -  Primary   Relevant Orders   Cytology - PAP   Encounter for surveillance of other contraceptive       Encounter for IUD removal and reinsertion         1) IUD - discussed data for 7 years use of Mirena, would like to proceed with change out today given coming up on 5 year mark.  2) ASCCP guidelines discussed.  Opts for every 3 year pap smears.   Collected today.  3) Mammogram - up to date  4) Routine healthcare maintenance per [facility]  5) A total of 20 minutes were spent in face-to-face contact with the patient during this encounter with over half of that time devoted to counseling and coordination of care.  5) Return in about 6 weeks (around 03/27/2019) for IUD string check.   Vena AustriaAndreas Dorin Stooksbury, MD, Evern CoreFACOG [facility], [facility] 02/13/2019, 9:12 AM

## 2019-02-13 NOTE — Addendum Note (Signed)
Addended by: Dorthula Nettles on: 02/13/2019 09:12 AM   Modules accepted: Orders

## 2019-02-14 LAB — CYTOLOGY - PAP
Diagnosis: NEGATIVE
HPV: NOT DETECTED

## 2019-02-18 ENCOUNTER — Encounter: Payer: Self-pay | Admitting: Obstetrics and Gynecology

## 2019-02-20 ENCOUNTER — Encounter: Payer: Self-pay | Admitting: Obstetrics and Gynecology

## 2019-02-28 ENCOUNTER — Encounter: Payer: Self-pay | Admitting: Obstetrics and Gynecology

## 2019-03-27 ENCOUNTER — Encounter: Payer: Self-pay | Admitting: Obstetrics and Gynecology

## 2019-03-27 ENCOUNTER — Ambulatory Visit (INDEPENDENT_AMBULATORY_CARE_PROVIDER_SITE_OTHER): Payer: BC Managed Care – PPO | Admitting: Obstetrics and Gynecology

## 2019-03-27 ENCOUNTER — Other Ambulatory Visit: Payer: Self-pay

## 2019-03-27 VITALS — BP 123/82 | Wt 206.0 lb

## 2019-03-27 DIAGNOSIS — B9689 Other specified bacterial agents as the cause of diseases classified elsewhere: Secondary | ICD-10-CM | POA: Diagnosis not present

## 2019-03-27 DIAGNOSIS — N760 Acute vaginitis: Secondary | ICD-10-CM

## 2019-03-27 DIAGNOSIS — Z30431 Encounter for routine checking of intrauterine contraceptive device: Secondary | ICD-10-CM

## 2019-03-27 MED ORDER — METRONIDAZOLE 500 MG PO TABS: 500.0000 mg | ORAL_TABLET | Freq: Two times a day (BID) | ORAL | 0 refills | Status: DC

## 2019-03-27 NOTE — Progress Notes (Signed)
Obstetrics & Gynecology Office Visit   Chief Complaint:  Chief Complaint  Patient presents with  . Follow-up    IUD string check  . Vaginitis    Discharge w/odor    History of Present Illness: 43 y.o. patient presenting for follow up of Mirena IUD placement 6+ weeks ago.  The indication for her IUD was cycle control.  She denies any complications since her IUD placement.  Still having some occasional spotting.  is not able to feel strings.    Ms. Bennie DallasDawn Norris is a 43 y.o. G2P2 who LMP was No LMP recorded. (Menstrual status: IUD)., presents today for a problem visit.   Patient complains of an abnormal vaginal discharge. Discharge described as: thin. Vaginal symptoms include odor.   Other associated symptoms: none.Menstrual pattern: Contraception: IUD.  She denies recent antibiotic exposure, denies changes in soaps, detergents coinciding with the onset of her symptoms.  She has not previously self treated or been under treatment by another provider for these symptoms.   Review of Systems: Review of Systems  Constitutional: Negative.   Gastrointestinal: Negative.   Genitourinary: Negative.     Past Medical History:  No past medical history on file.  Past Surgical History:  Past Surgical History:  Procedure Laterality Date  . CESAREAN SECTION  11/15/2013  . Mirena IUD placement      Gynecologic History: No LMP recorded. (Menstrual status: IUD).  Obstetric History: G2P2  Family History:  Family History  Problem Relation Age of Onset  . Breast cancer Maternal Aunt 660    Social History:  Social History   Socioeconomic History  . Marital status: Married    Spouse name: Not on file  . Number of children: Not on file  . Years of education: Not on file  . Highest education level: Not on file  Occupational History  . Not on file  Social Needs  . Financial resource strain: Not on file  . Food insecurity    Worry: Not on file    Inability: Not on file  .  Transportation needs    Medical: Not on file    Non-medical: Not on file  Tobacco Use  . Smoking status: Never Smoker  . Smokeless tobacco: Never Used  Substance and Sexual Activity  . Alcohol use: Yes    Comment: socially  . Drug use: Never  . Sexual activity: Yes    Birth control/protection: I.U.D.  Lifestyle  . Physical activity    Days per week: Not on file    Minutes per session: Not on file  . Stress: Not on file  Relationships  . Social Musicianconnections    Talks on phone: Not on file    Gets together: Not on file    Attends religious service: Not on file    Active member of club or organization: Not on file    Attends meetings of clubs or organizations: Not on file    Relationship status: Not on file  . Intimate partner violence    Fear of current or ex partner: Not on file    Emotionally abused: Not on file    Physically abused: Not on file    Forced sexual activity: Not on file  Other Topics Concern  . Not on file  Social History Narrative  . Not on file    Allergies:  Allergies  Allergen Reactions  . Miconazole Other (See Comments)    Swelling of the vaginal area  . Cephalexin Other (See  Comments)  . Clindamycin Other (See Comments)  . Influenza Vaccines Rash and Swelling    Swelling and rash at site of injection    Medications: Prior to Admission medications   Medication Sig Start Date End Date Taking? Authorizing Provider  levonorgestrel (MIRENA) 20 MCG/24HR IUD by Intrauterine route.    [provider]  metroNIDAZOLE (FLAGYL) 500 MG tablet Take 1 tablet (500 mg total) by mouth 2 (two) times daily. 03/27/19   Malachy Mood, MD    Physical Exam Blood pressure 123/82, weight 206 lb (93.4 kg). No LMP recorded. (Menstrual status: IUD).  General: NAD HEENT: normocephalic, anicteric Pulmonary: No increased work of breathing  Genitourinary:  External: Normal external female genitalia.  Normal urethral meatus, normal  Bartholin's and Skene's  glands.    Vagina: Normal vaginal mucosa, no evidence of prolapse.    Cervix: Grossly normal in appearance, no bleeding, IUD strings visualized 2cm  Uterus: Non-enlarged, mobile, normal contour.  No CMT  Adnexa: ovaries non-enlarged, no adnexal masses  Rectal: deferred  Lymphatic: no evidence of inguinal lymphadenopathy Extremities: no edema, erythema, or tenderness Neurologic: Grossly intact Psychiatric: mood appropriate, affect full  Female chaperone present for pelvic and breast  portions of the physical exam  Wet Prep: PH: 5.0 Clue Cells: Positive Fungal elements: Negative Trichomonas: Negative  Amsel's Criteria Bacterial Vaginosis Clinical diagnosis required presence of 3 of the below 4:  1) Homogenous thin white discharge present 2) Presence of clue cells present 3) Vaginal pH >4.5 present 4) Positive whiff test present   Assessment: 43 y.o. G2P2 IUD string check and bacterial vaginosis  Plan: Problem List Items Addressed This Visit    None    Visit Diagnoses    IUD check up    -  Primary   Bacterial vaginosis       Relevant Medications   metroNIDAZOLE (FLAGYL) 500 MG tablet       1.  The patient was given instructions to check her IUD strings monthly and call with any problems or concerns.  She should call for fevers, chills, abnormal vaginal discharge, pelvic pain, or other complaints.  2.   IUDs while effective at preventing pregnancy do not prevent transmission of sexually transmitted diseases and use of barrier methods for this purpose was discussed.  Low overall incidence of failure with 99.7% efficacy rate in typical use.  The patient has not contraindication to IUD placement.  3.  She will return for a annual exam in 1 year.  All questions answered.  4) Risk factors for bacterial vaginosis and candida infections discussed.  We discussed normal vaginal flora/microbiome.  Any factors that may alter the microbiome increase the risk of these opportunistic  infections.  These include changes in pH, antibiotic exposures, diabetes, wet bathing suits etc.  We discussed that treatment is aimed at eradicating abnormal bacterial overgrowth and or yeast.  There may be some role for vaginal probiotics in restoring normal vaginal flora.   - Rx flagyl  5)  A total of 15 minutes were spent in face-to-face contact with the patient during this encounter with over half of that time devoted to counseling and coordination of care.  6) Return in about 1 year (around 03/26/2020) for annual.   Malachy Mood, MD, [place], [facility] 03/27/2019, 4:52 PM

## 2019-04-05 ENCOUNTER — Other Ambulatory Visit: Payer: Self-pay | Admitting: Obstetrics and Gynecology

## 2019-04-05 MED ORDER — FLUCONAZOLE 150 MG PO TABS: 150.0000 mg | ORAL_TABLET | Freq: Once | ORAL | 0 refills | Status: AC

## 2019-09-13 ENCOUNTER — Other Ambulatory Visit: Payer: Self-pay

## 2019-09-13 ENCOUNTER — Ambulatory Visit (INDEPENDENT_AMBULATORY_CARE_PROVIDER_SITE_OTHER): Payer: BC Managed Care – PPO | Admitting: Certified Nurse Midwife

## 2019-09-13 ENCOUNTER — Encounter: Payer: Self-pay | Admitting: Certified Nurse Midwife

## 2019-09-13 VITALS — BP 124/80 | HR 96 | Temp 96.0°F | Ht 61.0 in | Wt 210.0 lb

## 2019-09-13 DIAGNOSIS — N762 Acute vulvitis: Secondary | ICD-10-CM

## 2019-09-13 DIAGNOSIS — R350 Frequency of micturition: Secondary | ICD-10-CM | POA: Diagnosis not present

## 2019-09-13 DIAGNOSIS — L292 Pruritus vulvae: Secondary | ICD-10-CM | POA: Diagnosis not present

## 2019-09-13 MED ORDER — BETAMETHASONE VALERATE 0.1 % EX OINT: 1.0000 "application " | TOPICAL_OINTMENT | Freq: Two times a day (BID) | CUTANEOUS | 0 refills | Status: DC

## 2019-09-14 LAB — POCT WET PREP (WET MOUNT): Trichomonas Wet Prep HPF POC: ABSENT

## 2019-09-14 NOTE — Progress Notes (Signed)
Obstetrics & Gynecology Office Visit   Chief Complaint:  Chief Complaint  Patient presents with  . Gynecologic Exam    vulva itches sporatically; two spots ? ingrown hairs; doesn't see anything; no new products    History of Present Illness: 44yr old BF, G2 P2002, presents with complaints of intermittent vulvar and perineal itching and irritation x a few weeks. The itching is located just above the clitoral area on the mons and also on the perineal area.  Denies any unusual discharge. Denies any recent use of antibiotics (was treated for BV in Sept) or the use of any new soaps or detergents. Her current form of contraception is Mirena IUD. No new sexual partners. She occasionally wears pads for SUI. Has not had to wear as much recently since working from home.   Review of Systems:  Review of Systems  Constitutional: Negative for chills, fever and weight loss.  HENT: Negative for congestion, sinus pain and sore throat.   Eyes: Negative for blurred vision and pain.  Respiratory: Negative for hemoptysis, shortness of breath and wheezing.   Cardiovascular: Negative for chest pain, palpitations and leg swelling.  Gastrointestinal: Negative for abdominal pain, blood in stool, diarrhea, heartburn, nausea and vomiting.  Genitourinary: Positive for frequency. Negative for dysuria, hematuria and urgency.       Urinary incontinence (SUI) present  Musculoskeletal: Negative for back pain, joint pain and myalgias.  Skin: Positive for itching. Negative for rash.  Neurological: Negative for dizziness, tingling and headaches.  Endo/Heme/Allergies: Negative for environmental allergies and polydipsia. Does not bruise/bleed easily.       Negative for hirsutism   Psychiatric/Behavioral: Negative for depression. The patient is nervous/anxious. The patient does not have insomnia.    Positive for breast tenderness  Past Medical History:  No past medical history on file.  Past Surgical History:  Past  Surgical History:  Procedure Laterality Date  . CESAREAN SECTION  11/15/2013  . Mirena IUD placement      Gynecologic History: No LMP recorded (lmp unknown). (Menstrual status: IUD).  Obstetric History: G2P2  Family History:  Family History  Problem Relation Age of Onset  . Breast cancer Maternal Aunt 64    Social History:  Social History   Socioeconomic History  . Marital status: Married    Spouse name: Not on file  . Number of children: Not on file  . Years of education: Not on file  . Highest education level: Not on file  Occupational History  . Not on file  Tobacco Use  . Smoking status: Never Smoker  . Smokeless tobacco: Never Used  Substance and Sexual Activity  . Alcohol use: Yes    Comment: socially  . Drug use: Never  . Sexual activity: Yes    Birth control/protection: I.U.D.  Other Topics Concern  . Not on file  Social History Narrative  . Not on file   Social Determinants of Health   Financial Resource Strain:   . Difficulty of Paying Living Expenses: Not on file  Food Insecurity:   . Worried About Programme researcher, broadcasting/film/video in the Last Year: Not on file  . Ran Out of Food in the Last Year: Not on file  Transportation Needs:   . Lack of Transportation (Medical): Not on file  . Lack of Transportation (Non-Medical): Not on file  Physical Activity:   . Days of Exercise per Week: Not on file  . Minutes of Exercise per Session: Not on file  Stress:   .  Feeling of Stress : Not on file  Social Connections:   . Frequency of Communication with Friends and Family: Not on file  . Frequency of Social Gatherings with Friends and Family: Not on file  . Attends Religious Services: Not on file  . Active Member of Clubs or Organizations: Not on file  . Attends Archivist Meetings: Not on file  . Marital Status: Not on file  Intimate Partner Violence:   . Fear of Current or Ex-Partner: Not on file  . Emotionally Abused: Not on file  . Physically Abused:  Not on file  . Sexually Abused: Not on file    Allergies:  Allergies  Allergen Reactions  . Miconazole Other (See Comments)    Swelling of the vaginal area  . Cephalexin Other (See Comments)  . Clindamycin Other (See Comments)  . Influenza Vaccines Rash and Swelling    Swelling and rash at site of injection    Medications: Prior to Admission medications   Medication Sig Start Date End Date Taking? Authorizing Provider  famotidine (PEPCID) 20 MG tablet Take 1 tablet by mouth daily.   Yes [provider]  levonorgestrel (MIRENA) 20 MCG/24HR IUD by Intrauterine route.   Yes [provider]   Physical Exam Vitals: BP 124/80   Pulse 96   Temp (!) 96 F (35.6 C)   Ht 5\' 1"  (1.549 m)   Wt 210 lb (95.3 kg)   LMP  (LMP Unknown)   BMI 39.68 kg/m      Physical Exam  Constitutional: She is oriented to person, place, and time. She appears well-developed and well-nourished. No distress.  Genitourinary:    Genitourinary Comments: Vilva:Mons is shaven.  Tiny papular rash above clitoris on mons, no inflammation there or on perineal area. No rash seen on perineal area. Vagina: white mucoepithelial discharge   Neurological: She is alert and oriented to person, place, and time.  Psychiatric: She has a normal mood and affect.   Results for orders placed or performed in visit on 09/13/19 (from the past 24 hour(s))  POCT Wet Prep [facility] [place])     Status: Normal   Collection Time: 09/14/19  9:11 AM  Result Value Ref Range   Source Wet Prep POC vagina    WBC, Wet Prep HPF POC     Bacteria Wet Prep HPF POC     BACTERIA WET PREP MORPHOLOGY POC     Clue Cells Wet Prep HPF POC None None   Clue Cells Wet Prep Whiff POC     Yeast Wet Prep HPF POC None None   KOH Wet Prep POC     Trichomonas Wet Prep HPF POC Absent Absent    Assessment: 44 y.o. G2P2 with vulvitis, possible folliculitis of mons  Plan: Trial of steroid ointment If no improvement consider biopsy of  areas. RTO if symptoms persist or worsen.  Dalia Heading, CNM

## 2019-12-02 ENCOUNTER — Encounter: Payer: Self-pay | Admitting: Obstetrics and Gynecology

## 2019-12-02 ENCOUNTER — Other Ambulatory Visit: Payer: Self-pay

## 2019-12-02 ENCOUNTER — Ambulatory Visit (INDEPENDENT_AMBULATORY_CARE_PROVIDER_SITE_OTHER): Payer: BC Managed Care – PPO | Admitting: Obstetrics and Gynecology

## 2019-12-02 VITALS — BP 106/62 | HR 82 | Ht 61.0 in | Wt 208.0 lb

## 2019-12-02 DIAGNOSIS — N761 Subacute and chronic vaginitis: Secondary | ICD-10-CM | POA: Diagnosis not present

## 2019-12-02 DIAGNOSIS — I863 Vulval varices: Secondary | ICD-10-CM | POA: Diagnosis not present

## 2019-12-02 MED ORDER — BETAMETHASONE VALERATE 0.1 % EX OINT: 1.0000 "application " | TOPICAL_OINTMENT | Freq: Two times a day (BID) | CUTANEOUS | 0 refills | Status: DC

## 2019-12-02 NOTE — Progress Notes (Signed)
Obstetrics & Gynecology Office Visit   Chief Complaint:  Chief Complaint  Patient presents with  . Vaginitis    2 itchy spots on outside vagina    History of Present Illness: Ms. Robin Norris is a 44 y.o. G2P2 who LMP was No LMP recorded. (Menstrual status: IUD)., presents today for a problem visit.   Patient complains of an abnormal vulvar irritation.  Was previously seen and evaluated by Marissa Calamity, CNM on 09/13/2019.  She does have a past medical history notable for poorly controled DMII.  Was treated with clobetasol with some improvement in symptoms.  Discharge described as: white and thin. Vaginal symptoms include local irritation.   Other associated symptoms: none.  She denies recent antibiotic exposure, denies changes in soaps, detergents coinciding with the onset of her symptoms.  She has previously self treated or been under treatment by another provider for these symptoms. Currently using Mirena IUD placed 02/13/2019  Review of Systems: review of systems negative unless otherwise noted in HPI  Past Medical History:  History reviewed. No pertinent past medical history.  Past Surgical History:  Past Surgical History:  Procedure Laterality Date  . CESAREAN SECTION  11/15/2013  . Mirena IUD placement      Gynecologic History: No LMP recorded. (Menstrual status: IUD).  Obstetric History: G2P2  Family History:  Family History  Problem Relation Age of Onset  . Breast cancer Maternal Aunt 60    Social History:  Social History   Socioeconomic History  . Marital status: Married    Spouse name: Not on file  . Number of children: Not on file  . Years of education: Not on file  . Highest education level: Not on file  Occupational History  . Not on file  Tobacco Use  . Smoking status: Never Smoker  . Smokeless tobacco: Never Used  Substance and Sexual Activity  . Alcohol use: Yes    Comment: socially  . Drug use: Never  . Sexual activity: Yes   Birth control/protection: I.U.D.  Other Topics Concern  . Not on file  Social History Narrative  . Not on file   Social Determinants of Health   Financial Resource Strain:   . Difficulty of Paying Living Expenses:   Food Insecurity:   . Worried About Charity fundraiser in the Last Year:   . Arboriculturist in the Last Year:   Transportation Needs:   . Film/video editor (Medical):   Marland Kitchen Lack of Transportation (Non-Medical):   Physical Activity:   . Days of Exercise per Week:   . Minutes of Exercise per Session:   Stress:   . Feeling of Stress :   Social Connections:   . Frequency of Communication with Friends and Family:   . Frequency of Social Gatherings with Friends and Family:   . Attends Religious Services:   . Active Member of Clubs or Organizations:   . Attends Archivist Meetings:   Marland Kitchen Marital Status:   Intimate Partner Violence:   . Fear of Current or Ex-Partner:   . Emotionally Abused:   Marland Kitchen Physically Abused:   . Sexually Abused:     Allergies:  Allergies  Allergen Reactions  . Miconazole Other (See Comments)    Swelling of the vaginal area  . Cephalexin Other (See Comments)  . Clindamycin Other (See Comments)  . Influenza Vaccines Rash and Swelling    Swelling and rash at site of injection    Medications:  Prior to Admission medications   Medication Sig Start Date End Date Taking? Authorizing Provider  betamethasone valerate ointment (VALISONE) 0.1 % Apply 1 application topically 2 (two) times daily. 09/13/19  Yes Farrel Conners, CNM  levonorgestrel (MIRENA) 20 MCG/24HR IUD by Intrauterine route.   Yes [provider]    Physical Exam Vitals:  Vitals:   12/02/19 1102  BP: 106/62  Pulse: 82   No LMP recorded. (Menstrual status: IUD).  General: NAD, well nourished, appears stated age HEENT: normocephalic, anicteric Pulmonary: No increased work of breathing Genitourinary:  External: Normal external female genitalia.   Normal urethral meatus, normal Bartholin's and Skene's glands.  There is mild erythema on the left labia majora and a mildy raised serpiginous area that disappears with traction on the labia.  Vagina: Normal vaginal mucosa, no evidence of prolapse.     Extremities: no edema, erythema, or tenderness Neurologic: Grossly intact Psychiatric: mood appropriate, affect full  Female chaperone present for pelvic  portions of the physical exam  Assessment: 44 y.o. G2P2 follow up chronic vaginitis   Plan: Problem List Items Addressed This Visit    None    Visit Diagnoses    Chronic vaginitis    -  Primary   Relevant Orders   NuSwab BV and Candida, NAA   Labial varicosities         1. Left labial varicosity - continue steroid.  Likely incidental finding but given erythema and some improvement on clobetasol would continue.  Risk factors for bacterial vaginosis and candida infections discussed.  We discussed normal vaginal flora/microbiome.  Any factors that may alter the microbiome increase the risk of these opportunistic infections.  These include changes in pH, antibiotic exposures, diabetes, wet bathing suits etc.  We discussed that treatment is aimed at eradicating abnormal bacterial overgrowth and or yeast.  There may be some role for vaginal probiotics in restoring normal vaginal flora.   - Nuswab collected given her DM II to rule out candida  2. A total of 15 minutes were spent in face-to-face contact with the patient during this encounter with over half of that time devoted to counseling and coordination of care.  3. Return in about 8 weeks (around 01/27/2020) for Follow up exam.    Vena Austria, MD, [facility], [facility] 12/02/2019, 11:30 AM

## 2019-12-04 LAB — NUSWAB BV AND CANDIDA, NAA
Candida albicans, NAA: NEGATIVE
Candida glabrata, NAA: NEGATIVE

## 2020-01-31 ENCOUNTER — Other Ambulatory Visit: Payer: Self-pay | Admitting: Family Medicine

## 2020-01-31 DIAGNOSIS — Z1231 Encounter for screening mammogram for malignant neoplasm of breast: Secondary | ICD-10-CM

## 2020-02-03 ENCOUNTER — Ambulatory Visit (INDEPENDENT_AMBULATORY_CARE_PROVIDER_SITE_OTHER): Payer: BC Managed Care – PPO | Admitting: Obstetrics and Gynecology

## 2020-02-03 ENCOUNTER — Other Ambulatory Visit: Payer: Self-pay

## 2020-02-03 ENCOUNTER — Encounter: Payer: Self-pay | Admitting: Obstetrics and Gynecology

## 2020-02-03 VITALS — BP 112/58 | Wt 212.0 lb

## 2020-02-03 DIAGNOSIS — L292 Pruritus vulvae: Secondary | ICD-10-CM

## 2020-02-03 DIAGNOSIS — I863 Vulval varices: Secondary | ICD-10-CM

## 2020-02-03 MED ORDER — HYDROXYZINE HCL 25 MG PO TABS: 25.0000 mg | ORAL_TABLET | Freq: Four times a day (QID) | ORAL | 2 refills | Status: AC | PRN

## 2020-02-03 MED ORDER — BETAMETHASONE VALERATE 0.1 % EX OINT: 1.0000 "application " | TOPICAL_OINTMENT | Freq: Two times a day (BID) | CUTANEOUS | 0 refills | Status: AC

## 2020-02-03 NOTE — Progress Notes (Signed)
Obstetrics & Gynecology Office Visit   Chief Complaint:  Chief Complaint  Patient presents with  . Follow-up    vaginitis is same    History of Present Illness: 44 y.o. G2P2 presenting for medication follow up for a diagnosis of vaginal irritation and itching at the introitus.  She is currently being managed with clobetasol cream.   The patient reports some minor improvment but continued symptoms 5 out of 7 days a week.  Culture last visit negative for candida or BV.Marland Kitchen   She has not noted any side-effects or new symptoms.    Review of Systems: Review of Systems  Constitutional: Negative.   Genitourinary: Negative.   Skin: Positive for itching and rash.   Past Medical History:  History reviewed. No pertinent past medical history.  Past Surgical History:  Past Surgical History:  Procedure Laterality Date  . CESAREAN SECTION  11/15/2013  . Mirena IUD placement      Gynecologic History: No LMP recorded. (Menstrual status: IUD).  Obstetric History: G2P2  Family History:  Family History  Problem Relation Age of Onset  . Breast cancer Maternal Aunt 50    Social History:  Social History   Socioeconomic History  . Marital status: Married    Spouse name: Not on file  . Number of children: Not on file  . Years of education: Not on file  . Highest education level: Not on file  Occupational History  . Not on file  Tobacco Use  . Smoking status: Never Smoker  . Smokeless tobacco: Never Used  Vaping Use  . Vaping Use: Never used  Substance and Sexual Activity  . Alcohol use: Yes    Comment: socially  . Drug use: Never  . Sexual activity: Yes    Birth control/protection: I.U.D.  Other Topics Concern  . Not on file  Social History Narrative  . Not on file   Social Determinants of Health   Financial Resource Strain:   . Difficulty of Paying Living Expenses:   Food Insecurity:   . Worried About Programme researcher, broadcasting/film/video in the Last Year:   . Barista in the  Last Year:   Transportation Needs:   . Freight forwarder (Medical):   Marland Kitchen Lack of Transportation (Non-Medical):   Physical Activity:   . Days of Exercise per Week:   . Minutes of Exercise per Session:   Stress:   . Feeling of Stress :   Social Connections:   . Frequency of Communication with Friends and Family:   . Frequency of Social Gatherings with Friends and Family:   . Attends Religious Services:   . Active Member of Clubs or Organizations:   . Attends Banker Meetings:   Marland Kitchen Marital Status:   Intimate Partner Violence:   . Fear of Current or Ex-Partner:   . Emotionally Abused:   Marland Kitchen Physically Abused:   . Sexually Abused:     Allergies:  Allergies  Allergen Reactions  . Miconazole Other (See Comments)    Swelling of the vaginal area  . Cephalexin Other (See Comments)  . Clindamycin Other (See Comments)  . Influenza Vaccines Rash and Swelling    Swelling and rash at site of injection    Medications: Prior to Admission medications   Medication Sig Start Date End Date Taking? Authorizing Provider  betamethasone valerate ointment (VALISONE) 0.1 % Apply 1 application topically 2 (two) times daily. 12/02/19  Yes Vena Austria, MD  levonorgestrel [facility])  20 MCG/24HR IUD by Intrauterine route.   Yes [provider]    Physical Exam Vitals:  Vitals:   02/03/20 1339  BP: (!) 112/58   No LMP recorded. (Menstrual status: IUD).  General: NAD HEENT: normocephalic, anicteric Pulmonary: No increased work of breathing Genitourinary:  External: Normal external female genitalia.  Normal urethral meatus, normal Bartholin's and Skene's glands. Still with serpanguitus area in the left labia majora     Vagina: Normal vaginal mucosa, no evidence of prolapse.    Rectal: deferred Extremities: no edema, erythema, or tenderness Neurologic: Grossly intact Psychiatric: mood appropriate, affect full  Female chaperone present for pelvic  portions of the  physical exam  Assessment: 44 y.o. G2P2 follow up vulvar pruritus   Plan: Problem List Items Addressed This Visit    None    Visit Diagnoses    Labial varicosities    -  Primary   Relevant Orders   Ambulatory referral to Interventional Radiology   Vulvar pruritus       Relevant Orders   Ambulatory referral to Interventional Radiology     1) Vaginal irritation - negative culture, there does appear to be some small labial varicosities.  Minimal improvement with clobetasol - Rx for hydroxizine - will [facility] to [facility] to sclerosing options  2) A total of 15 minutes were spent in face-to-face contact with the patient during this encounter with over half of that time devoted to counseling and coordination of care.  3) Return in about 1 year (around 02/02/2021) for annual.    Vena Austria, MD, [facility], [facility] 02/03/2020, 1:54 PM

## 2020-02-12 ENCOUNTER — Other Ambulatory Visit: Payer: Self-pay

## 2020-02-12 ENCOUNTER — Ambulatory Visit
Admission: RE | Admit: 2020-02-12 | Discharge: 2020-02-12 | Disposition: A | Discharge status: Home / Self Care | Payer: BC Managed Care – PPO | Source: Ambulatory Visit | Attending: Family Medicine | Admitting: Family Medicine

## 2020-02-12 DIAGNOSIS — Z1231 Encounter for screening mammogram for malignant neoplasm of breast: Secondary | ICD-10-CM | POA: Diagnosis present

## 2020-05-04 ENCOUNTER — Ambulatory Visit (INDEPENDENT_AMBULATORY_CARE_PROVIDER_SITE_OTHER): Payer: BC Managed Care – PPO

## 2020-05-04 ENCOUNTER — Other Ambulatory Visit: Payer: Self-pay

## 2020-05-04 ENCOUNTER — Ambulatory Visit
Admission: EM | Admit: 2020-05-04 | Discharge: 2020-05-04 | Disposition: A | Discharge status: Home / Self Care | Payer: BC Managed Care – PPO | Attending: Emergency Medicine | Admitting: Emergency Medicine

## 2020-05-04 ENCOUNTER — Encounter: Payer: Self-pay | Admitting: Emergency Medicine

## 2020-05-04 DIAGNOSIS — R059 Cough, unspecified: Secondary | ICD-10-CM | POA: Diagnosis not present

## 2020-05-04 DIAGNOSIS — Z20822 Contact with and (suspected) exposure to covid-19: Secondary | ICD-10-CM | POA: Insufficient documentation

## 2020-05-04 DIAGNOSIS — R062 Wheezing: Secondary | ICD-10-CM | POA: Diagnosis not present

## 2020-05-04 LAB — SARS CORONAVIRUS 2 (TAT 6-24 HRS): SARS Coronavirus 2: NEGATIVE

## 2020-05-04 MED ORDER — BENZONATATE 100 MG PO CAPS: 100.0000 mg | ORAL_CAPSULE | Freq: Three times a day (TID) | ORAL | 0 refills | Status: AC | PRN

## 2020-05-04 MED ORDER — ALBUTEROL SULFATE HFA 108 (90 BASE) MCG/ACT IN AERS: 2.0000 | INHALATION_SPRAY | RESPIRATORY_TRACT | 0 refills | Status: AC | PRN

## 2020-05-04 MED ORDER — AMOXICILLIN-POT CLAVULANATE 875-125 MG PO TABS: 1.0000 | ORAL_TABLET | Freq: Two times a day (BID) | ORAL | 0 refills | Status: AC

## 2020-05-04 NOTE — ED Triage Notes (Signed)
Pt states she had teeth extracted about 3 days ago. She states they told her she has a "rough surgery" and was vomiting. She states have wheezing, shortness of breath and a dry cough when she woke from surgery. They gave her a breathing treatment while in the recovery. She is concerned she aspirated.

## 2020-05-04 NOTE — ED Provider Notes (Signed)
Emergency Department Provider Note  ____________________________________________  Time seen: Approximately 11:00 AM  I have reviewed the triage vital signs and the nursing notes.   HISTORY  Chief Complaint Wheezing and Cough   Historian Patient     HPI Robin Norris is a 44 y.o. female presents to the emergency department with some shortness of breath and wheezing for the past 3 days since patient had a dental extraction procedure.  Patient states that she had multiple episodes of vomiting shortly after surgery and she has personally concern for aspiration.  She denies fever and chills at home.  She states that she has had some nasal congestion and rhinorrhea.  No headache, chest pain, chest tightness, vomiting or diarrhea at home.  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  Denies swelling of the lower extremities.  She has not been attempting any alleviating measures at home.   History reviewed. No pertinent past medical history.   Immunizations up to date:  Yes.     History reviewed. No pertinent past medical history.  Patient Active Problem List   Diagnosis Date Noted  . Sickle cell trait (HCC) 03/11/2015  . Allergic rhinitis 07/25/2011  . Acid reflux 07/25/2011    Past Surgical History:  Procedure Laterality Date  . CESAREAN SECTION  11/15/2013  . Mirena IUD placement      Prior to Admission medications   Medication Sig Start Date End Date Taking? Authorizing Provider  hydrOXYzine (ATARAX/VISTARIL) 25 MG tablet Take 1 tablet (25 mg total) by mouth every 6 (six) hours as needed for itching. 02/03/20  Yes Vena Austria, MD  levonorgestrel (MIRENA) 20 MCG/24HR IUD by Intrauterine route.   Yes [provider]  albuterol (VENTOLIN HFA) 108 (90 Base) MCG/ACT inhaler Inhale 2 puffs into the lungs every 4 (four) hours as needed for wheezing or shortness of breath. 05/04/20   Orvil Feil, PA-C   amoxicillin-clavulanate (AUGMENTIN) 875-125 MG tablet Take 1 tablet by mouth 2 (two) times daily for 10 days. 05/04/20 05/14/20  Orvil Feil, PA-C  benzonatate (TESSALON PERLES) 100 MG capsule Take 1 capsule (100 mg total) by mouth 3 (three) times daily as needed for up to 7 days for cough. 05/04/20 05/11/20  Orvil Feil, PA-C  betamethasone valerate ointment (VALISONE) 0.1 % Apply 1 application topically 2 (two) times daily. 02/03/20   Vena Austria, MD  HYDROcodone-acetaminophen (NORCO/VICODIN) 5-325 MG tablet Take 1 tablet by mouth every 6 (six) hours as needed. 05/01/20   [provider]  ibuprofen (ADVIL) 400 MG tablet Take 400 mg by mouth 3 (three) times daily as needed. 05/01/20   [provider]    Allergies Miconazole, Cephalexin, Clindamycin, and Influenza vaccines  Family History  Problem Relation Age of Onset  . Breast cancer Maternal Aunt 36    Social History Social History   Tobacco Use  . Smoking status: Never Smoker  . Smokeless tobacco: Never Used  Vaping Use  . Vaping Use: Never used  Substance Use Topics  . Alcohol use: Yes    Comment: socially  . Drug use: Never     Review of Systems  Constitutional: No fever/chills Eyes:  No discharge ENT: No upper respiratory complaints. Respiratory: Patient has cough and wheezing.  Gastrointestinal:   No nausea, no vomiting.  No diarrhea.  No constipation. Musculoskeletal: Negative for musculoskeletal pain. Skin: Negative for rash, abrasions, lacerations, ecchymosis.   ____________________________________________   PHYSICAL EXAM:  VITAL SIGNS:  ED Triage Vitals  Enc Vitals Group     BP 05/04/20 0948 126/84     Pulse Rate 05/04/20 0948 94     Resp 05/04/20 0948 18     Temp 05/04/20 0948 98.9 F (37.2 C)     Temp Source 05/04/20 0948 Oral     SpO2 05/04/20 0948 97 %     Weight 05/04/20 0944 212 lb 1.3 oz (96.2 kg)     Height 05/04/20 0944 5\' 1"  (1.549 m)     Head Circumference  --      Peak Flow --      Pain Score 05/04/20 0944 0     Pain Loc --      Pain Edu? --      Excl. in GC? --     Constitutional: Alert and oriented. Patient is lying supine. Eyes: Conjunctivae are normal. PERRL. EOMI. Head: Atraumatic. ENT:      Ears: Tympanic membranes are mildly injected with mild effusion bilaterally.       Nose: No congestion/rhinnorhea.      Mouth/Throat: Mucous membranes are moist. Posterior pharynx is mildly erythematous.  Hematological/Lymphatic/Immunilogical: No cervical lymphadenopathy.  Cardiovascular: Normal rate, regular rhythm. Normal S1 and S2.  Good peripheral circulation. Respiratory: Normal respiratory effort without tachypnea or retractions. Lungs CTAB. Good air entry to the bases with no decreased or absent breath sounds. Gastrointestinal: Bowel sounds 4 quadrants. Soft and nontender to palpation. No guarding or rigidity. No palpable masses. No distention. No CVA tenderness. Musculoskeletal: Full range of motion to all extremities. No gross deformities appreciated. Neurologic:  Normal speech and language. No gross focal neurologic deficits are appreciated.  Skin:  Skin is warm, dry and intact. No rash noted. Psychiatric: Mood and affect are normal. Speech and behavior are normal. Patient exhibits appropriate insight and judgement.   ____________________________________________   LABS (all labs ordered are listed, but only abnormal results are displayed)  Labs Reviewed  SARS CORONAVIRUS 2 (TAT 6-24 HRS)   ____________________________________________  EKG   ____________________________________________  RADIOLOGY 07/04/20, personally viewed and evaluated these images (plain radiographs) as part of my medical decision making, as well as reviewing the written report by the radiologist.  DG Chest 2 View  Result Date: 05/04/2020 CLINICAL DATA:  Possible aspiration.  Cough EXAM: CHEST - 2 VIEW COMPARISON:  None. FINDINGS: The heart  size and mediastinal contours are within normal limits. Both lungs are clear. The visualized skeletal structures are unremarkable. IMPRESSION: No active cardiopulmonary disease. Electronically Signed   By: 07/04/2020 M.D.   On: 05/04/2020 10:18    ____________________________________________    PROCEDURES  Procedure(s) performed:     Procedures     Medications - No data to display   ____________________________________________   INITIAL IMPRESSION / ASSESSMENT AND PLAN / ED COURSE  Pertinent labs & imaging results that were available during my care of the patient were reviewed by me and considered in my medical decision making (see chart for details).      Assessment and Plan: Cough Wheezing  44 year old female presents to the emergency department with nonproductive cough, nasal congestion, wheezing and shortness of breath for the past 2 to 3 days.  Vital signs were reassuring at triage.  On physical exam, patient had no increased work of breathing.  She did have a trace expiratory wheeze auscultated at the lung bases bilaterally.  Differential diagnosis includes postoperative pneumonia, COVID-19, aspiration pneumonia, unspecified viral URI, reactive airway...  Chest x-ray revealed no  consolidations, opacities or infiltrates.  Patient elected to have sendoff COVID-19 testing conducted at the urgent care today.  We discussed the pros and cons of empiric treatment for aspiration pneumonia.  I recommended obtaining results from COVID-19 testing before starting antibiotic.  I did prescribe Augmentin twice daily for the next 10 days to cover patient for early aspiration pneumonia.  She was prescribed an albuterol inhaler and Tessalon Perles for cough.  Patient is PERC negative and I have low suspicion for PE at this time.  Return precautions were given to return with new or worsening symptoms.   ____________________________________________  FINAL CLINICAL IMPRESSION(S) /  ED DIAGNOSES  Final diagnoses:  Cough  Wheezing      NEW MEDICATIONS STARTED DURING THIS VISIT:  ED Discharge Orders         Ordered    amoxicillin-clavulanate (AUGMENTIN) 875-125 MG tablet  2 times daily        05/04/20 1058    albuterol (VENTOLIN HFA) 108 (90 Base) MCG/ACT inhaler  Every 4 hours PRN        05/04/20 1058    benzonatate (TESSALON PERLES) 100 MG capsule  3 times daily PRN        05/04/20 1058              This chart was dictated using voice recognition software/Dragon. Despite best efforts to proofread, errors can occur which can change the meaning. Any change was purely unintentional.     Orvil Feil, PA-C 05/04/20 1107

## 2020-05-04 NOTE — Discharge Instructions (Signed)
Your chest x-ray shows no signs of pneumonia or atelectasis (folding of the lung) Please await to start antibiotics until you get your COVID-19 results back. You can take 2 puffs of albuterol every 4 hours as needed for wheezing and shortness of breath. You can take Tessalon Perles at night for cough. Return to the urgent care with new or worsening symptoms.

## 2021-02-22 ENCOUNTER — Other Ambulatory Visit: Payer: Self-pay | Admitting: Family Medicine

## 2021-02-22 DIAGNOSIS — Z1231 Encounter for screening mammogram for malignant neoplasm of breast: Secondary | ICD-10-CM

## 2021-03-08 ENCOUNTER — Ambulatory Visit
Admission: RE | Admit: 2021-03-08 | Discharge: 2021-03-08 | Disposition: A | Discharge status: Home / Self Care | Payer: BC Managed Care – PPO | Source: Ambulatory Visit | Attending: Family Medicine | Admitting: Family Medicine

## 2021-03-08 ENCOUNTER — Other Ambulatory Visit: Payer: Self-pay

## 2021-03-08 DIAGNOSIS — Z1231 Encounter for screening mammogram for malignant neoplasm of breast: Secondary | ICD-10-CM | POA: Diagnosis not present

## 2021-06-28 NOTE — Telephone Encounter (Signed)
Mirena rcvd/charged 02/13/2019

## 2021-07-13 ENCOUNTER — Other Ambulatory Visit: Payer: Self-pay | Admitting: Physical Medicine & Rehabilitation

## 2021-07-13 DIAGNOSIS — M5442 Lumbago with sciatica, left side: Secondary | ICD-10-CM

## 2021-07-28 ENCOUNTER — Other Ambulatory Visit: Payer: Self-pay

## 2021-07-28 ENCOUNTER — Ambulatory Visit
Admission: RE | Admit: 2021-07-28 | Discharge: 2021-07-28 | Disposition: A | Discharge status: Home / Self Care | Payer: BC Managed Care – PPO | Source: Ambulatory Visit | Attending: Physical Medicine & Rehabilitation | Admitting: Physical Medicine & Rehabilitation

## 2021-07-28 DIAGNOSIS — M5442 Lumbago with sciatica, left side: Secondary | ICD-10-CM | POA: Diagnosis present

## 2021-12-16 ENCOUNTER — Other Ambulatory Visit: Payer: Self-pay | Admitting: Family Medicine

## 2021-12-16 DIAGNOSIS — R102 Pelvic and perineal pain: Secondary | ICD-10-CM

## 2021-12-17 ENCOUNTER — Ambulatory Visit
Admission: RE | Admit: 2021-12-17 | Discharge: 2021-12-17 | Disposition: A | Discharge status: Home / Self Care | Payer: BC Managed Care – PPO | Source: Ambulatory Visit | Attending: Family Medicine | Admitting: Family Medicine

## 2021-12-17 DIAGNOSIS — R102 Pelvic and perineal pain: Secondary | ICD-10-CM | POA: Insufficient documentation

## 2022-03-10 ENCOUNTER — Other Ambulatory Visit: Payer: Self-pay | Admitting: Family Medicine

## 2022-03-10 DIAGNOSIS — Z1231 Encounter for screening mammogram for malignant neoplasm of breast: Secondary | ICD-10-CM

## 2022-03-29 ENCOUNTER — Ambulatory Visit
Admission: RE | Admit: 2022-03-29 | Discharge: 2022-03-29 | Disposition: A | Discharge status: Home / Self Care | Payer: BC Managed Care – PPO | Source: Ambulatory Visit | Attending: Family Medicine | Admitting: Family Medicine

## 2022-03-29 DIAGNOSIS — Z1231 Encounter for screening mammogram for malignant neoplasm of breast: Secondary | ICD-10-CM | POA: Diagnosis not present

## 2022-04-06 ENCOUNTER — Ambulatory Visit: Payer: BC Managed Care – PPO

## 2023-03-24 ENCOUNTER — Other Ambulatory Visit: Payer: Self-pay | Admitting: Family Medicine

## 2023-03-24 DIAGNOSIS — Z1231 Encounter for screening mammogram for malignant neoplasm of breast: Secondary | ICD-10-CM

## 2023-04-20 ENCOUNTER — Ambulatory Visit
Admission: RE | Admit: 2023-04-20 | Discharge: 2023-04-20 | Disposition: A | Discharge status: Home / Self Care | Payer: BC Managed Care – PPO | Source: Ambulatory Visit | Attending: Family Medicine | Admitting: Family Medicine

## 2023-04-20 DIAGNOSIS — Z1231 Encounter for screening mammogram for malignant neoplasm of breast: Secondary | ICD-10-CM | POA: Insufficient documentation

## 2023-05-04 IMAGING — US US PELVIS COMPLETE WITH TRANSVAGINAL
1 series · 13 of 25 positions shown · non-contrast
Comparison: None

CLINICAL DATA: Pelvic pain in a female for 3 weeks, has IUD, LMP
1412, prior Caesarean section



[Series 1: us pelvis complete with transvaginal · 0.22mm/px · 98 acquisitions, 13 frames shown]
[im 1/98]
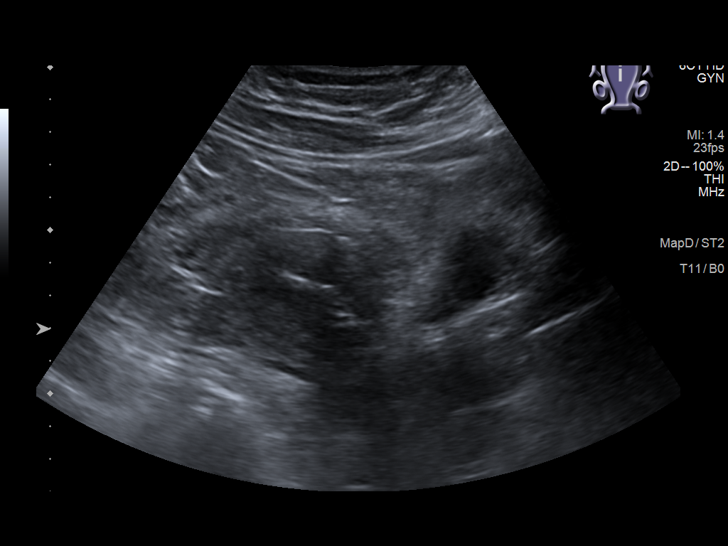
[im 9/98]
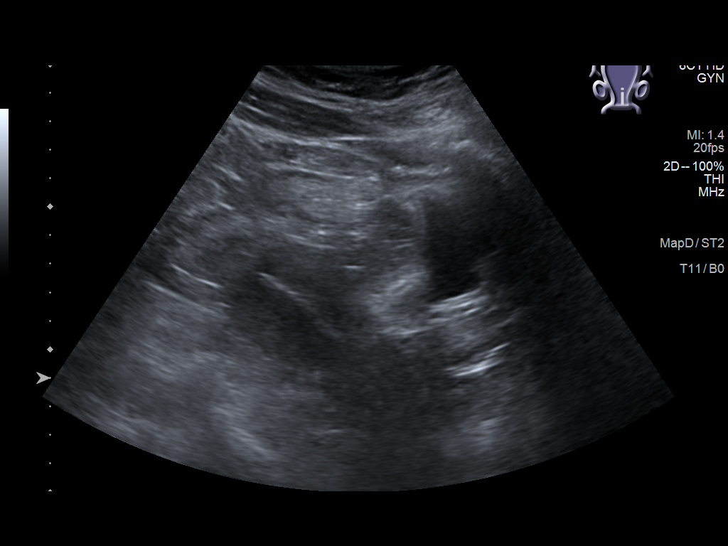
[im 17/98]
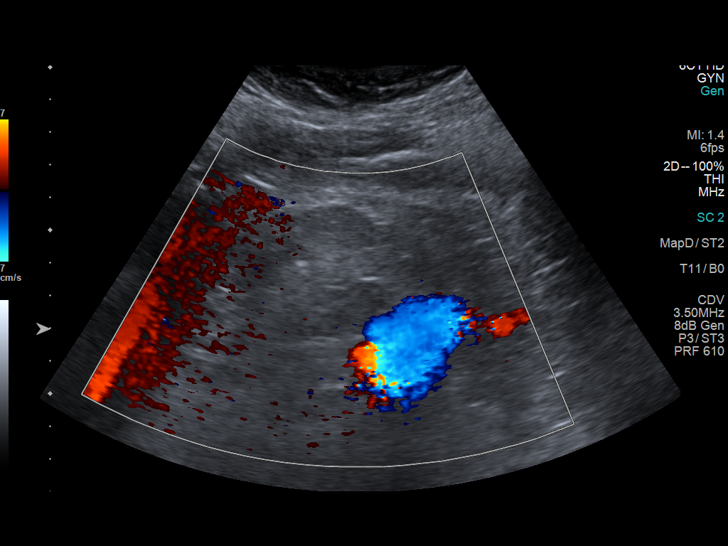
[im 25/98]
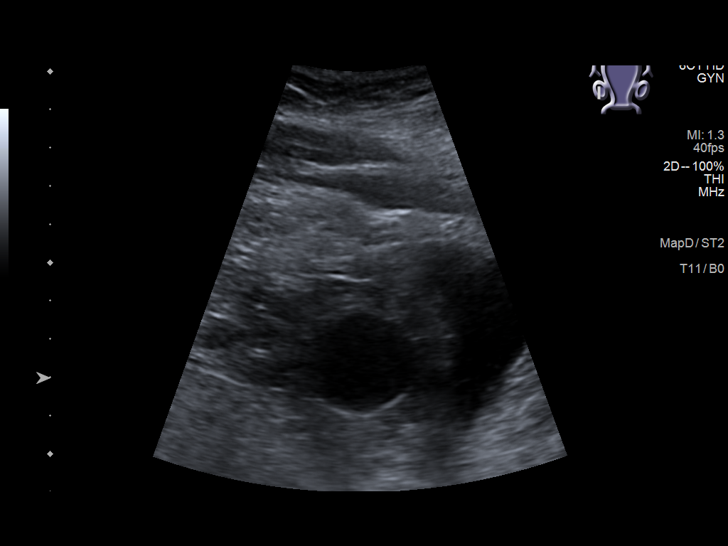
[im 33/98]
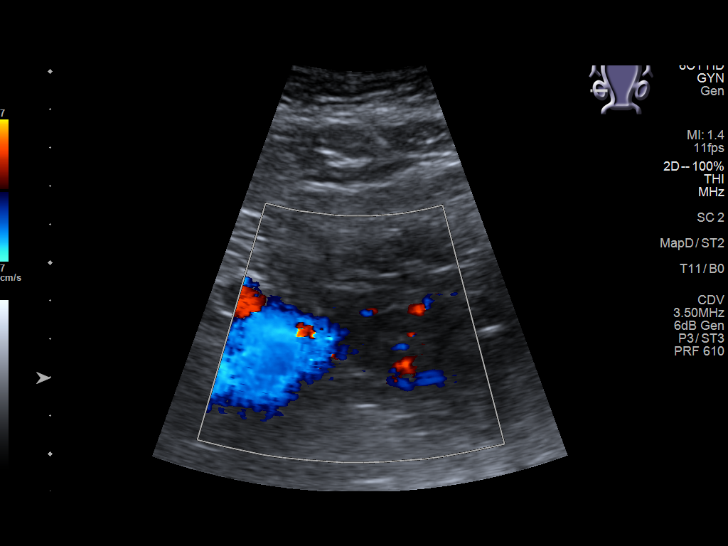
[im 41/98]
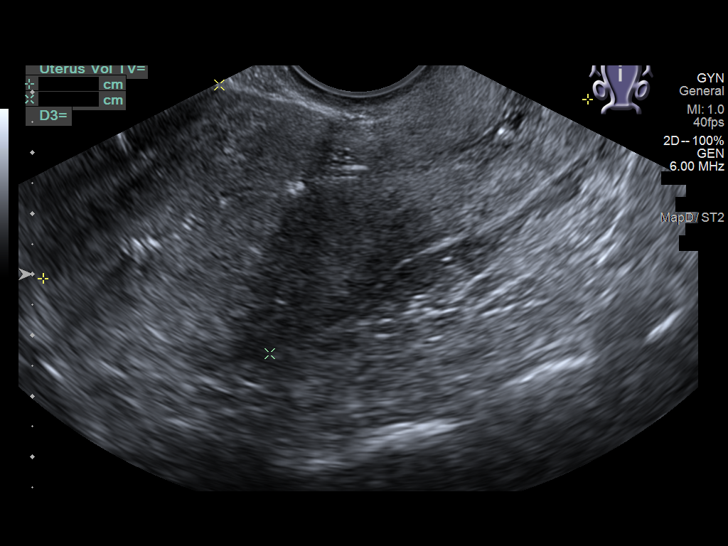
[im 49/98]
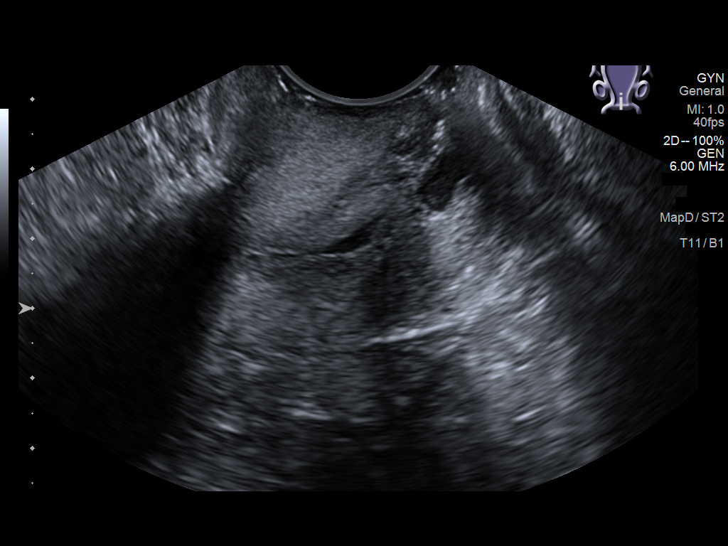
[im 57/98]
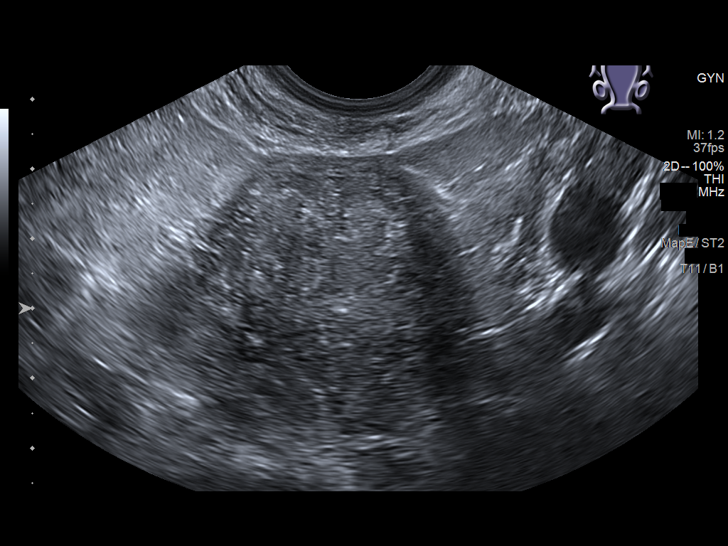
[im 65/98]
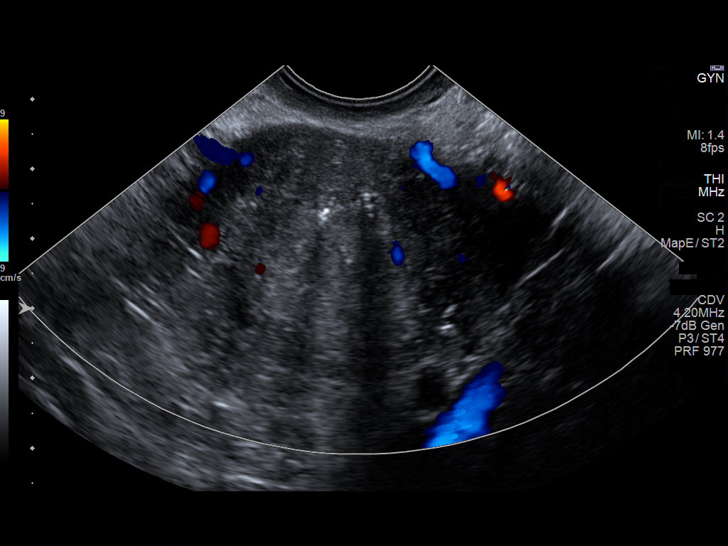
[im 73/98]
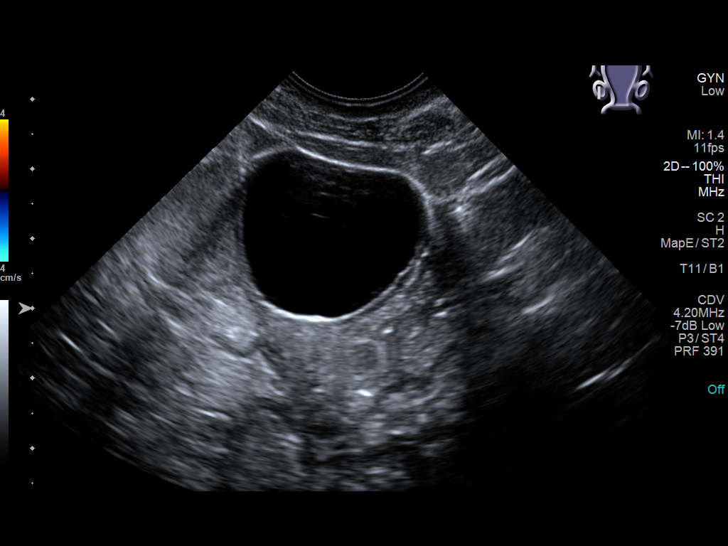
[im 81/98]
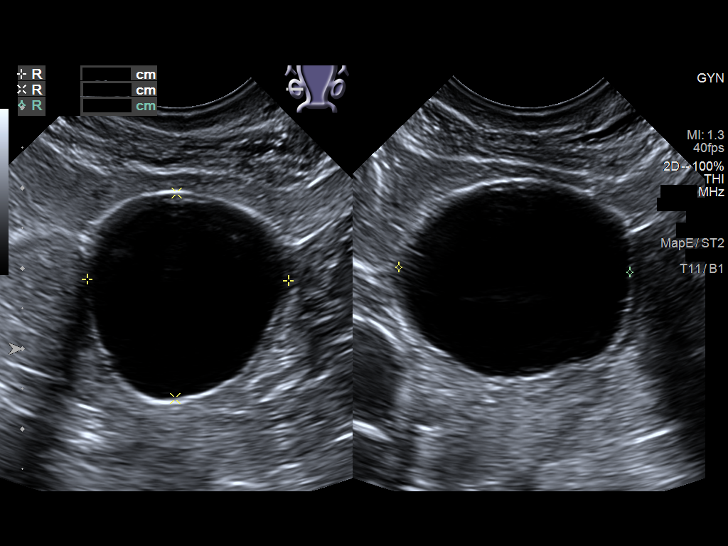
[im 89/98]
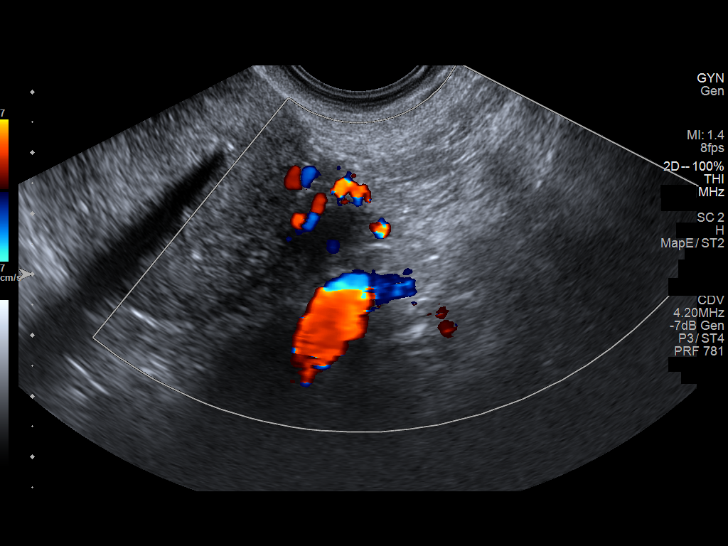
[im 98/98]
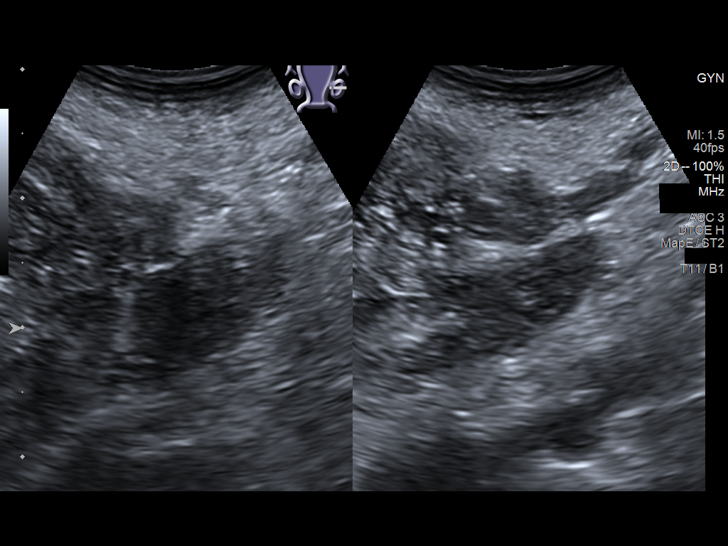

[13 of 25 positions shown; findings below may reference images not displayed]

FINDINGS: Uterus

Measurements: 9.4 x 4.5 x 4.6 cm = volume: 103 mL. Anteverted.
Heterogeneous myometrium. Anterior wall Caesarean section scar. No
discrete mass.

Endometrium

Thickness: 10 mm. IUD in expected position at upper uterine segment
endometrial canal. No endometrial fluid or mass

Right ovary

Measurements: 3.7 x 2.7 x 2.5 cm = volume: 13.5 mL. Dominant simple
physiologic follicle 2.9 cm diameter; no follow-up imaging
recommended. Otherwise unremarkable.

Left ovary

Measurements: 1.8 x 0.9 x 1.5 cm = volume: 1.2 mL. Normal morphology
without mass

Other findings

No free pelvic fluid.  No adnexal masses.
IMPRESSION: IUD in expected position at the upper uterine segment endometrial
canal.

Remainder of exam unremarkable.

## 2023-05-24 ENCOUNTER — Encounter: Payer: Self-pay | Admitting: *Deleted

## 2023-06-05 ENCOUNTER — Encounter: Payer: Self-pay | Admitting: Gastroenterology

## 2023-06-05 ENCOUNTER — Ambulatory Visit
Admission: RE | Admit: 2023-06-05 | Discharge: 2023-06-05 | Disposition: A | Discharge status: Home / Self Care | Payer: BC Managed Care – PPO | Attending: Gastroenterology | Admitting: Gastroenterology

## 2023-06-05 ENCOUNTER — Ambulatory Visit: Payer: BC Managed Care – PPO | Admitting: General Practice

## 2023-06-05 ENCOUNTER — Encounter
Admission: RE | Disposition: A | Discharge status: Home / Self Care | Payer: Self-pay | Source: Home / Self Care | Attending: Gastroenterology

## 2023-06-05 DIAGNOSIS — D123 Benign neoplasm of transverse colon: Secondary | ICD-10-CM | POA: Diagnosis not present

## 2023-06-05 DIAGNOSIS — Z1211 Encounter for screening for malignant neoplasm of colon: Secondary | ICD-10-CM | POA: Diagnosis present

## 2023-06-05 DIAGNOSIS — E669 Obesity, unspecified: Secondary | ICD-10-CM | POA: Insufficient documentation

## 2023-06-05 DIAGNOSIS — K640 First degree hemorrhoids: Secondary | ICD-10-CM | POA: Insufficient documentation

## 2023-06-05 DIAGNOSIS — K5730 Diverticulosis of large intestine without perforation or abscess without bleeding: Secondary | ICD-10-CM | POA: Insufficient documentation

## 2023-06-05 HISTORY — PX: COLONOSCOPY WITH PROPOFOL: SHX5780

## 2023-06-05 HISTORY — PX: POLYPECTOMY: SHX5525

## 2023-06-05 LAB — POCT PREGNANCY, URINE: Preg Test, Ur: NEGATIVE

## 2023-06-05 SURGERY — COLONOSCOPY WITH PROPOFOL
Anesthesia: General

## 2023-06-05 MED ORDER — GLYCOPYRROLATE 0.2 MG/ML IJ SOLN
INTRAMUSCULAR | Status: DC | PRN
  Administered 2023-06-05: .2 mg via INTRAVENOUS

## 2023-06-05 MED ORDER — ACETAMINOPHEN 325 MG PO TABS: 650.0000 mg | ORAL_TABLET | Freq: Once | ORAL | Status: DC

## 2023-06-05 MED ORDER — PROPOFOL 500 MG/50ML IV EMUL
INTRAVENOUS | Status: DC | PRN
  Administered 2023-06-05: 165 ug/kg/min via INTRAVENOUS

## 2023-06-05 MED ORDER — LIDOCAINE HCL (CARDIAC) PF 100 MG/5ML IV SOSY
PREFILLED_SYRINGE | INTRAVENOUS | Status: DC | PRN
  Administered 2023-06-05: 100 mg via INTRAVENOUS

## 2023-06-05 MED ORDER — ACETAMINOPHEN 500 MG PO TABS
1000.0000 mg | ORAL_TABLET | Freq: Once | ORAL | Status: AC
  Administered 2023-06-05: 1000 mg via ORAL

## 2023-06-05 MED ORDER — ACETAMINOPHEN 500 MG PO TABS
ORAL_TABLET | ORAL | Status: AC
Start: 2023-06-05 — End: ?
  Filled 2023-06-05: qty 2

## 2023-06-05 MED ORDER — PROPOFOL 10 MG/ML IV BOLUS
INTRAVENOUS | Status: DC | PRN
  Administered 2023-06-05: 70 mg via INTRAVENOUS
  Administered 2023-06-05: 30 mg via INTRAVENOUS

## 2023-06-05 MED ORDER — SODIUM CHLORIDE 0.9 % IV SOLN
INTRAVENOUS | Status: DC
  Administered 2023-06-05: 20 mL/h via INTRAVENOUS

## 2023-06-05 MED ORDER — MIDAZOLAM HCL 2 MG/2ML IJ SOLN
INTRAMUSCULAR | Status: AC
  Filled 2023-06-05: qty 2

## 2023-06-05 MED ORDER — MIDAZOLAM HCL 2 MG/2ML IJ SOLN
INTRAMUSCULAR | Status: DC | PRN
  Administered 2023-06-05: 2 mg via INTRAVENOUS

## 2023-06-05 NOTE — Transfer of Care (Signed)
Immediate Anesthesia Transfer of Care Note  Patient: Robin Norris  Procedure(s) Performed: COLONOSCOPY WITH PROPOFOL POLYPECTOMY  Patient Location: Endoscopy Unit  Anesthesia Type:General  Level of Consciousness: awake, drowsy, and patient cooperative  Airway & Oxygen Therapy: Patient Spontanous Breathing  Post-op Assessment: Report given to RN and Post -op Vital signs reviewed and stable  Post vital signs: Reviewed and stable  Last Vitals:  Vitals Value Taken Time  BP 109/63 06/05/23 0848  Temp 35.9 C 06/05/23 0848  Pulse 97 06/05/23 0852  Resp 29 06/05/23 0852  SpO2 98 % 06/05/23 0852  Vitals shown include unfiled device data.  Last Pain:  Vitals:   06/05/23 0848  TempSrc: Temporal  PainSc: Asleep         Complications: No notable events documented.

## 2023-06-05 NOTE — Op Note (Signed)
[facility] Patient Name: Robin Norris Procedure Date: 06/05/2023 8:23 AM MRN: [id number] Account #: [id number] Date of Birth: 1976-06-25 Admit Type: Outpatient Age: 47 Room: [facility] ENDO ROOM 3 Gender: Female Note Status: Finalized Instrument Name: Prentice Docker [id number] Procedure:             Colonoscopy Indications:           Screening for colorectal malignant neoplasm Providers:             Eather Colas MD, MD Referring MD:          Eather Colas MD, MD (Referring MD), No Local Md,                         MD (Referring MD) Medicines:             Monitored Anesthesia Care Complications:         No immediate complications. Estimated blood loss:                         Minimal. Procedure:             Pre-Anesthesia Assessment:                        - Prior to the procedure, a History and Physical was                         performed, and patient medications and allergies were                         reviewed. The patient is competent. The risks and                         benefits of the procedure and the sedation options and                         risks were discussed with the patient. All questions                         were answered and informed consent was obtained.                         Patient identification and proposed procedure were                         verified by the physician, the nurse, the                         anesthesiologist, the anesthetist and the technician                         in the endoscopy suite. Mental Status Examination:                         alert and oriented. Airway Examination: normal                         oropharyngeal airway and neck mobility. Respiratory  Examination: clear to auscultation. CV Examination:                         normal. Prophylactic Antibiotics: The patient does not                         require prophylactic antibiotics. Prior                          Anticoagulants: The patient has taken no anticoagulant                         or antiplatelet agents. ASA Grade Assessment: III - A                         patient with severe systemic disease. After reviewing                         the risks and benefits, the patient was deemed in                         satisfactory condition to undergo the procedure. The                         anesthesia plan was to use monitored anesthesia care                         (MAC). Immediately prior to administration of                         medications, the patient was re-assessed for adequacy                         to receive sedatives. The heart rate, respiratory                         rate, oxygen saturations, blood pressure, adequacy of                         pulmonary ventilation, and response to care were                         monitored throughout the procedure. The physical                         status of the patient was re-assessed after the                         procedure.                        After obtaining informed consent, the colonoscope was                         passed under direct vision. Throughout the procedure,                         the patient's blood pressure, pulse, and oxygen  saturations were monitored continuously. The                         Colonoscope was introduced through the anus and                         advanced to the the cecum, identified by appendiceal                         orifice and ileocecal valve. The colonoscopy was                         somewhat difficult due to significant looping.                         Successful completion of the procedure was aided by                         changing the patient to a prone position. The patient                         tolerated the procedure well. The quality of the bowel                         preparation was adequate to identify polyps. The                          ileocecal valve, appendiceal orifice, and rectum were                         photographed. Findings:      The perianal and digital rectal examinations were normal.      A single large-mouthed diverticulum was found in the ascending colon.      A 2 mm polyp was found in the distal transverse colon. The polyp was       sessile. The polyp was removed with a cold snare. Resection and       retrieval were complete. Estimated blood loss was minimal.      Internal hemorrhoids were found during retroflexion. The hemorrhoids       were Grade I (internal hemorrhoids that do not prolapse).      The exam was otherwise without abnormality on direct and retroflexion       views. Impression:            - Diverticulosis in the ascending colon.                        - One 2 mm polyp in the distal transverse colon,                         removed with a cold snare. Resected and retrieved.                        - Internal hemorrhoids.                        - The examination was otherwise normal on direct and  retroflexion views. Recommendation:        - Discharge patient to home.                        - Resume previous diet.                        - Continue present medications.                        - Await pathology results.                        - Repeat colonoscopy in 7 years for surveillance.                        - Return to referring physician as previously                         scheduled. Procedure Code(s):     --- Professional ---                        [phone], Colonoscopy, flexible; with removal of                         tumor(s), polyp(s), or other lesion(s) by snare                         technique Diagnosis Code(s):     --- Professional ---                        Z12.11, Encounter for screening for malignant neoplasm                         of colon                        K64.0, First degree hemorrhoids                        D12.3, Benign neoplasm of  transverse colon (hepatic                         flexure or splenic flexure)                        K57.30, Diverticulosis of large intestine without                         perforation or abscess without bleeding CPT copyright 2022 American Medical Association. All rights reserved. The codes documented in this report are preliminary and upon coder review may  be revised to meet current compliance requirements. Eather Colas MD, MD 06/05/2023 8:48:55 AM Number of Addenda: 0 Note Initiated On: 06/05/2023 8:23 AM Scope Withdrawal Time: 0 hours 8 minutes 29 seconds  Total Procedure Duration: 0 hours 15 minutes 34 seconds  Estimated Blood Loss:  Estimated blood loss was minimal.      [facility]

## 2023-06-05 NOTE — H&P (Signed)
Outpatient short stay form Pre-procedure 06/05/2023  Regis Bill, MD  Primary Physician: Rayetta Humphrey, MD  Reason for visit:  Screening colonoscopy  History of present illness:    47 y/o lady with history of obesity here for index screening colonoscopy. No blood thinners. No family history of GI malignancies. History of a c section.    Current Facility-Administered Medications:    0.9 %  sodium chloride infusion, , Intravenous, Continuous, Shahidah Nesbitt, Rossie Muskrat, MD, Last Rate: 20 mL/hr at 06/05/23 0751, 20 mL/hr at 06/05/23 0751  Medications Prior to Admission  Medication Sig Dispense Refill Last Dose   albuterol (VENTOLIN HFA) 108 (90 Base) MCG/ACT inhaler Inhale 2 puffs into the lungs every 4 (four) hours as needed for wheezing or shortness of breath. 1 each 0 Past Week   betamethasone valerate ointment (VALISONE) 0.1 % Apply 1 application topically 2 (two) times daily. 30 g 0 Past Week   HYDROcodone-acetaminophen (NORCO/VICODIN) 5-325 MG tablet Take 1 tablet by mouth every 6 (six) hours as needed.   Past Week   hydrOXYzine (ATARAX/VISTARIL) 25 MG tablet Take 1 tablet (25 mg total) by mouth every 6 (six) hours as needed for itching. 30 tablet 2 Past Week   ibuprofen (ADVIL) 400 MG tablet Take 400 mg by mouth 3 (three) times daily as needed.   Past Week   levonorgestrel (MIRENA) 20 MCG/24HR IUD by Intrauterine route.   Past Week     Allergies  Allergen Reactions   Miconazole Other (See Comments)    Swelling of the vaginal area   Cephalexin Other (See Comments)   Clindamycin Other (See Comments)   Influenza Vaccines Rash and Swelling    Swelling and rash at site of injection     History reviewed. No pertinent past medical history.  Review of systems:  Otherwise negative.    Physical Exam  Gen: Alert, oriented. Appears stated age.  HEENT: PERRLA. Lungs: No respiratory distress CV: RRR Abd: soft, benign, no masses Ext: No edema    Planned procedures:  Proceed with colonoscopy. The patient understands the nature of the planned procedure, indications, risks, alternatives and potential complications including but not limited to bleeding, infection, perforation, damage to internal organs and possible oversedation/side effects from anesthesia. The patient agrees and gives consent to proceed.  Please refer to procedure notes for findings, recommendations and patient disposition/instructions.     Regis Bill, MD [facility]

## 2023-06-05 NOTE — Anesthesia Postprocedure Evaluation (Signed)
Anesthesia Post Note  Patient: Robin Norris  Procedure(s) Performed: COLONOSCOPY WITH PROPOFOL POLYPECTOMY  Patient location during evaluation: PACU Anesthesia Type: General Level of consciousness: awake and alert Pain management: pain level controlled Vital Signs Assessment: post-procedure vital signs reviewed and stable Respiratory status: spontaneous breathing, nonlabored ventilation, respiratory function stable and patient connected to nasal cannula oxygen Cardiovascular status: blood pressure returned to baseline and stable Postop Assessment: no apparent nausea or vomiting Anesthetic complications: no  No notable events documented.   Last Vitals:  Vitals:   06/05/23 0739 06/05/23 0848  BP: 120/77 109/63  Pulse: 84 (!) 102  Resp: 20 (!) 21  Temp: (!) 35.7 C (!) 35.9 C  SpO2: 100% 98%    Last Pain:  Vitals:   06/05/23 0848  TempSrc: Temporal  PainSc: Asleep                 Stephanie Coup

## 2023-06-05 NOTE — Progress Notes (Signed)
C/o headache. Dr. Lorette Ang notified. VSS. Tylenol adm pol

## 2023-06-05 NOTE — Interval H&P Note (Signed)
History and Physical Interval Note:  06/05/2023 8:18 AM  Robin Norris  has presented today for surgery, with the diagnosis of Z12.11 (ICD-10-CM) - Colon cancer screening.  The various methods of treatment have been discussed with the patient and family. After consideration of risks, benefits and other options for treatment, the patient has consented to  Procedure(s): COLONOSCOPY WITH PROPOFOL (N/A) as a surgical intervention.  The patient's history has been reviewed, patient examined, no change in status, stable for surgery.  I have reviewed the patient's chart and labs.  Questions were answered to the patient's satisfaction.     Regis Bill  Ok to proceed with colonoscopy

## 2023-06-05 NOTE — Anesthesia Preprocedure Evaluation (Signed)
Anesthesia Evaluation  Patient identified by MRN, date of birth, ID band Patient awake    Reviewed: Allergy & Precautions, NPO status , Patient's Chart, lab work & pertinent test results  Airway Mallampati: III  TM Distance: >3 FB Neck ROM: full    Dental  (+) Chipped, Dental Advidsory Given   Pulmonary asthma , sleep apnea and Continuous Positive Airway Pressure Ventilation    Pulmonary exam normal        Cardiovascular negative cardio ROS Normal cardiovascular exam     Neuro/Psych negative neurological ROS  negative psych ROS   GI/Hepatic negative GI ROS, Neg liver ROS,,,  Endo/Other    Morbid obesity  Renal/GU negative Renal ROS  negative genitourinary   Musculoskeletal   Abdominal   Peds  Hematology negative hematology ROS (+)   Anesthesia Other Findings History reviewed. No pertinent past medical history.  Past Surgical History: 11/15/2013: CESAREAN SECTION No date: Mirena IUD placement  BMI    Body Mass Index: 40.35 kg/m      Reproductive/Obstetrics negative OB ROS                             Anesthesia Physical Anesthesia Plan  ASA: 3  Anesthesia Plan: General   Post-op Pain Management: Minimal or no pain anticipated   Induction: Intravenous  PONV Risk Score and Plan: 3 and Propofol infusion, TIVA and Ondansetron  Airway Management Planned: Nasal Cannula  Additional Equipment: None  Intra-op Plan:   Post-operative Plan:   Informed Consent: I have reviewed the patients History and Physical, chart, labs and discussed the procedure including the risks, benefits and alternatives for the proposed anesthesia with the patient or authorized representative who has indicated his/her understanding and acceptance.     Dental advisory given  Plan Discussed with: CRNA and Surgeon  Anesthesia Plan Comments: (Discussed risks of anesthesia with patient, including  possibility of difficulty with spontaneous ventilation under anesthesia necessitating airway intervention, PONV, and rare risks such as cardiac or respiratory or neurological events, and allergic reactions. Discussed the role of CRNA in patient's perioperative care. Patient understands.)       Anesthesia Quick Evaluation

## 2023-06-05 NOTE — Anesthesia Procedure Notes (Signed)
Procedure Name: General with mask airway Date/Time: 06/05/2023 8:26 AM  Performed by: Mohammed Kindle, CRNAPre-anesthesia Checklist: Patient identified, Emergency Drugs available, Suction available and Patient being monitored Patient Re-evaluated:Patient Re-evaluated prior to induction Oxygen Delivery Method: Simple face mask Induction Type: IV induction Placement Confirmation: positive ETCO2, CO2 detector and breath sounds checked- equal and bilateral Dental Injury: Teeth and Oropharynx as per pre-operative assessment

## 2023-06-06 LAB — SURGICAL PATHOLOGY

## 2024-05-03 ENCOUNTER — Other Ambulatory Visit: Payer: Self-pay | Admitting: Family Medicine

## 2024-05-03 DIAGNOSIS — Z1231 Encounter for screening mammogram for malignant neoplasm of breast: Secondary | ICD-10-CM

## 2024-06-06 ENCOUNTER — Ambulatory Visit
Admission: RE | Admit: 2024-06-06 | Discharge: 2024-06-06 | Disposition: A | Discharge status: Home / Self Care | Payer: Self-pay | Source: Ambulatory Visit | Attending: Family Medicine | Admitting: Family Medicine

## 2024-06-06 DIAGNOSIS — Z1231 Encounter for screening mammogram for malignant neoplasm of breast: Secondary | ICD-10-CM | POA: Insufficient documentation
